# Patient Record
Sex: Male | Born: 1950
Health system: Southern US, Community
[De-identification: ages and names within clinical notes are randomized; demographics above are authoritative.]

## PROBLEM LIST (undated history)

## (undated) DIAGNOSIS — G4700 Insomnia, unspecified: Secondary | ICD-10-CM

## (undated) DIAGNOSIS — I4891 Unspecified atrial fibrillation: Secondary | ICD-10-CM

## (undated) DIAGNOSIS — M797 Fibromyalgia: Secondary | ICD-10-CM

## (undated) DIAGNOSIS — H35039 Hypertensive retinopathy, unspecified eye: Secondary | ICD-10-CM

## (undated) DIAGNOSIS — K219 Gastro-esophageal reflux disease without esophagitis: Secondary | ICD-10-CM

## (undated) DIAGNOSIS — F319 Bipolar disorder, unspecified: Secondary | ICD-10-CM

## (undated) DIAGNOSIS — A0472 Enterocolitis due to Clostridium difficile, not specified as recurrent: Secondary | ICD-10-CM

## (undated) DIAGNOSIS — R7989 Other specified abnormal findings of blood chemistry: Secondary | ICD-10-CM

## (undated) DIAGNOSIS — E119 Type 2 diabetes mellitus without complications: Secondary | ICD-10-CM

## (undated) DIAGNOSIS — I10 Essential (primary) hypertension: Secondary | ICD-10-CM

## (undated) DIAGNOSIS — E039 Hypothyroidism, unspecified: Secondary | ICD-10-CM

## (undated) DIAGNOSIS — G629 Polyneuropathy, unspecified: Secondary | ICD-10-CM

## (undated) DIAGNOSIS — E669 Obesity, unspecified: Secondary | ICD-10-CM

## (undated) DIAGNOSIS — K760 Fatty (change of) liver, not elsewhere classified: Secondary | ICD-10-CM

## (undated) DIAGNOSIS — F32A Depression, unspecified: Secondary | ICD-10-CM

## (undated) DIAGNOSIS — N400 Enlarged prostate without lower urinary tract symptoms: Secondary | ICD-10-CM

## (undated) DIAGNOSIS — E785 Hyperlipidemia, unspecified: Secondary | ICD-10-CM

## (undated) DIAGNOSIS — J45909 Unspecified asthma, uncomplicated: Secondary | ICD-10-CM

## (undated) DIAGNOSIS — R195 Other fecal abnormalities: Secondary | ICD-10-CM

## (undated) DIAGNOSIS — E538 Deficiency of other specified B group vitamins: Secondary | ICD-10-CM

## (undated) HISTORY — DX: Deficiency of other specified B group vitamins: E53.8

## (undated) HISTORY — DX: Other fecal abnormalities: R19.5

## (undated) HISTORY — DX: Hypothyroidism, unspecified: E03.9

## (undated) HISTORY — DX: Enterocolitis due to Clostridium difficile, not specified as recurrent: A04.72

## (undated) HISTORY — DX: Bipolar disorder, unspecified: F31.9

## (undated) HISTORY — DX: Hyperlipidemia, unspecified: E78.5

## (undated) HISTORY — DX: Insomnia, unspecified: G47.00

## (undated) HISTORY — DX: Gastro-esophageal reflux disease without esophagitis: K21.9

## (undated) HISTORY — DX: Obesity, unspecified: E66.9

## (undated) HISTORY — DX: Fatty (change of) liver, not elsewhere classified: K76.0

## (undated) HISTORY — DX: Depression, unspecified: F32.A

## (undated) HISTORY — DX: Polyneuropathy, unspecified: G62.9

## (undated) HISTORY — DX: Other specified abnormal findings of blood chemistry: R79.89

## (undated) HISTORY — DX: Hypertensive retinopathy, unspecified eye: H35.039

## (undated) HISTORY — DX: Unspecified atrial fibrillation: I48.91

## (undated) HISTORY — DX: Benign prostatic hyperplasia without lower urinary tract symptoms: N40.0

## (undated) HISTORY — PX: CHOLECYSTECTOMY: SHX55

## (undated) HISTORY — DX: Fibromyalgia: M79.7

---

## 2013-03-26 DIAGNOSIS — J339 Nasal polyp, unspecified: Secondary | ICD-10-CM | POA: Insufficient documentation

## 2013-03-26 DIAGNOSIS — R490 Dysphonia: Secondary | ICD-10-CM | POA: Insufficient documentation

## 2013-03-26 DIAGNOSIS — J383 Other diseases of vocal cords: Secondary | ICD-10-CM | POA: Insufficient documentation

## 2017-04-20 ENCOUNTER — Emergency Department (HOSPITAL_COMMUNITY)
Admission: EM | Admit: 2017-04-20 | Discharge: 2017-04-20 | Disposition: A | Discharge status: Home / Self Care | Payer: Medicare Other | Attending: Emergency Medicine | Admitting: Emergency Medicine

## 2017-04-20 ENCOUNTER — Emergency Department (HOSPITAL_BASED_OUTPATIENT_CLINIC_OR_DEPARTMENT_OTHER): Admit: 2017-04-20 | Discharge: 2017-04-20 | Disposition: A | Discharge status: Home / Self Care | Payer: Medicare Other

## 2017-04-20 ENCOUNTER — Encounter (HOSPITAL_COMMUNITY): Payer: Self-pay | Admitting: Emergency Medicine

## 2017-04-20 DIAGNOSIS — I10 Essential (primary) hypertension: Secondary | ICD-10-CM | POA: Insufficient documentation

## 2017-04-20 DIAGNOSIS — J45909 Unspecified asthma, uncomplicated: Secondary | ICD-10-CM | POA: Diagnosis not present

## 2017-04-20 DIAGNOSIS — E119 Type 2 diabetes mellitus without complications: Secondary | ICD-10-CM | POA: Insufficient documentation

## 2017-04-20 DIAGNOSIS — L03115 Cellulitis of right lower limb: Secondary | ICD-10-CM | POA: Diagnosis not present

## 2017-04-20 DIAGNOSIS — M79609 Pain in unspecified limb: Secondary | ICD-10-CM | POA: Diagnosis not present

## 2017-04-20 DIAGNOSIS — R2241 Localized swelling, mass and lump, right lower limb: Secondary | ICD-10-CM | POA: Diagnosis present

## 2017-04-20 HISTORY — DX: Essential (primary) hypertension: I10

## 2017-04-20 HISTORY — DX: Type 2 diabetes mellitus without complications: E11.9

## 2017-04-20 HISTORY — DX: Unspecified asthma, uncomplicated: J45.909

## 2017-04-20 LAB — CBC WITH DIFFERENTIAL/PLATELET
BASOS ABS: 0 10*3/uL (ref 0.0–0.1)
Basophils Relative: 1 %
EOS ABS: 0.6 10*3/uL (ref 0.0–0.7)
EOS PCT: 7 %
HCT: 41.5 % (ref 39.0–52.0)
Hemoglobin: 12.8 g/dL — ABNORMAL LOW (ref 13.0–17.0)
Lymphocytes Relative: 24 %
Lymphs Abs: 2 10*3/uL (ref 0.7–4.0)
MCH: 26 pg (ref 26.0–34.0)
MCHC: 30.8 g/dL (ref 30.0–36.0)
MCV: 84.3 fL (ref 78.0–100.0)
Monocytes Absolute: 0.7 10*3/uL (ref 0.1–1.0)
Monocytes Relative: 8 %
Neutro Abs: 5.1 10*3/uL (ref 1.7–7.7)
Neutrophils Relative %: 60 %
PLATELETS: 248 10*3/uL (ref 150–400)
RBC: 4.92 MIL/uL (ref 4.22–5.81)
RDW: 14.6 % (ref 11.5–15.5)
WBC: 8.5 10*3/uL (ref 4.0–10.5)

## 2017-04-20 LAB — COMPREHENSIVE METABOLIC PANEL
ALT: 24 U/L (ref 17–63)
AST: 25 U/L (ref 15–41)
Albumin: 3.5 g/dL (ref 3.5–5.0)
Alkaline Phosphatase: 75 U/L (ref 38–126)
Anion gap: 8 (ref 5–15)
BUN: 7 mg/dL (ref 6–20)
CO2: 20 mmol/L — ABNORMAL LOW (ref 22–32)
CREATININE: 1.11 mg/dL (ref 0.61–1.24)
Calcium: 8.7 mg/dL — ABNORMAL LOW (ref 8.9–10.3)
Chloride: 108 mmol/L (ref 101–111)
GFR calc Af Amer: >60 mL/min (ref >60)
Glucose, Bld: 181 mg/dL — ABNORMAL HIGH (ref 65–99)
POTASSIUM: 3.9 mmol/L (ref 3.5–5.1)
Sodium: 136 mmol/L (ref 135–145)
Total Bilirubin: 0.7 mg/dL (ref 0.3–1.2)
Total Protein: 6.7 g/dL (ref 6.5–8.1)

## 2017-04-20 MED ORDER — CEPHALEXIN 500 MG PO CAPS: 500.0000 mg | ORAL_CAPSULE | Freq: Four times a day (QID) | ORAL | 0 refills | Status: DC

## 2017-04-20 NOTE — Discharge Instructions (Signed)
There are no signs of DVT on the ultrasound.  Please take all of your antibiotics until finished!   You may develop abdominal discomfort or diarrhea from the antibiotic.  You may help offset this with probiotics which you can buy or get in yogurt. Do not eat or take the probiotics until 2 hours after your antibiotic.   Follow-up with your primary care provider later this week or early next week for recheck and further management of this issue.  Return to the ED for worsening symptoms.

## 2017-04-20 NOTE — Progress Notes (Signed)
*  PRELIMINARY RESULTS* Vascular Ultrasound Bilateral lower extremity venous duplex has been completed.  Preliminary findings: No evidence of deep vein thrombosis or baker's cysts bilaterally. Somewhat difficult evaluation of bilateral groin vessels due to heavy panis.   Everrett Coombe 04/20/2017, 4:31 PM

## 2017-04-20 NOTE — ED Provider Notes (Signed)
[facility] Provider Note   CSN: [id number] Arrival date & time: 04/20/17  1059     History   Chief Complaint Chief Complaint  Patient presents with  . Leg Swelling    HPI Antonio Andrade is a 67 y.o. male.  HPI   Antonio Andrade is a 67 y.o. male, with a history of asthma, DM, thrombophlebitis, and HTN, presenting to the ED with bilateral lower extremity edema and erythema beginning about a week ago.  Patient states symptoms began simultaneously.  He does note some previous intermittent lower extremity edema at baseline.  No pain in the lower extremities at rest or with ambulation, however, pain with palpation.  Denies chest pain, shortness of breath, fever/chills, or any other complaints.       Past Medical History:  Diagnosis Date  . Asthma   . Diabetes mellitus without complication ([place])   . Hypertension     There are no active problems to display for this patient.   Past Surgical History:  Procedure Laterality Date  . CHOLECYSTECTOMY         Home Medications    Prior to Admission medications   Medication Sig Start Date End Date Taking? Authorizing Provider  cephALEXin (KEFLEX) 500 MG capsule Take 1 capsule (500 mg total) by mouth 4 (four) times daily. 04/20/17   Tip Atienza, Helane Gunther, PA-C    Family History No family history on file.  Social History Social History   Tobacco Use  . Smoking status: Never Smoker  Substance Use Topics  . Alcohol use: No    Frequency: Never  . Drug use: No     Allergies   Amoxicillin   Review of Systems Review of Systems  Constitutional: Negative for chills and fever.  Respiratory: Negative for shortness of breath.   Cardiovascular: Positive for leg swelling. Negative for chest pain.  Gastrointestinal: Negative for abdominal pain.  Skin: Positive for color change.  Neurological: Negative for weakness and numbness.  All other systems reviewed and are negative.    Physical  Exam Updated Vital Signs BP (!) 153/75 (BP Location: Right Arm)   Pulse 93   Temp 98.2 F (36.8 C) (Oral)   Resp 17   SpO2 97%   Physical Exam  Constitutional: He appears well-developed and well-nourished. No distress.  HENT:  Head: Normocephalic and atraumatic.  Eyes: Conjunctivae are normal.  Neck: Neck supple.  Cardiovascular: Normal rate, regular rhythm, normal heart sounds and intact distal pulses.  Pulses:      Dorsalis pedis pulses are 2+ on the right side, and 2+ on the left side.       Posterior tibial pulses are 2+ on the right side, and 2+ on the left side.  Pulmonary/Chest: Effort normal and breath sounds normal. No respiratory distress.  Abdominal: Soft. There is no tenderness. There is no guarding.  Musculoskeletal: He exhibits edema and tenderness.  Erythema noted to the anterior right lower leg with minor tenderness.  Mild erythema to the anterior left lower leg.  Minor pitting edema noted bilaterally. No tenderness to the calf or elsewhere in the deep venous system.  No pain with range of motion of the lower extremities.  Lymphadenopathy:    He has no cervical adenopathy.  Neurological: He is alert.  Skin: Skin is warm and dry. Capillary refill takes less than 2 seconds. He is not diaphoretic.  Psychiatric: He has a normal mood and affect. His behavior is normal.  Nursing note and vitals  reviewed.            ED Treatments / Results  Labs (all labs ordered are listed, but only abnormal results are displayed) Labs Reviewed  COMPREHENSIVE METABOLIC PANEL - Abnormal; Notable for the following components:      Result Value   CO2 20 (*)    Glucose, Bld 181 (*)    Calcium 8.7 (*)    All other components within normal limits  CBC WITH DIFFERENTIAL/PLATELET - Abnormal; Notable for the following components:   Hemoglobin 12.8 (*)    All other components within normal limits    EKG  EKG Interpretation None       Radiology No results  found.  Procedures Procedures (including critical care time)  Medications Ordered in ED Medications - No data to display   Initial Impression / Assessment and Plan / ED Course  I have reviewed the triage vital signs and the nursing notes.  Pertinent labs & imaging results that were available during my care of the patient were reviewed by me and considered in my medical decision making (see chart for details).     Patient presents with erythema mostly on the right lower extremity.  Suspect possible cellulitis.  No evidence of DVT on ultrasound.  PCP follow-up. The patient was given instructions for home care as well as return precautions. Patient voices understanding of these instructions, accepts the plan, and is comfortable with discharge.  Findings and plan of care discussed with Quintella Reichert, MD. Dr. Ralene Bathe personally evaluated and examined this patient.  Final Clinical Impressions(s) / ED Diagnoses   Final diagnoses:  Cellulitis of right lower extremity    ED Discharge Orders        Ordered    cephALEXin (KEFLEX) 500 MG capsule  4 times daily     04/20/17 1654       Lorayne Bender, PA-C 04/20/17 1702    Quintella Reichert, MD 04/21/17 1557

## 2017-04-20 NOTE — ED Triage Notes (Signed)
Pt reports redness, swelling and pain to bilateral lower legs x1 weeks, pt called his pcp who sent him here. Pt ambulatory to triage. Denies hx of blood clots

## 2017-05-19 ENCOUNTER — Other Ambulatory Visit: Payer: Self-pay | Admitting: Family Medicine

## 2017-05-19 ENCOUNTER — Ambulatory Visit
Admission: RE | Admit: 2017-05-19 | Discharge: 2017-05-19 | Disposition: A | Discharge status: Home / Self Care | Payer: Medicare Other | Source: Ambulatory Visit | Attending: Family Medicine | Admitting: Family Medicine

## 2017-05-19 DIAGNOSIS — L03115 Cellulitis of right lower limb: Secondary | ICD-10-CM

## 2018-03-01 ENCOUNTER — Observation Stay (HOSPITAL_COMMUNITY)
Admission: EM | Admit: 2018-03-01 | Discharge: 2018-03-03 | Disposition: A | Discharge status: Home / Self Care | Payer: Medicare Other | Attending: Internal Medicine | Admitting: Internal Medicine

## 2018-03-01 ENCOUNTER — Emergency Department (HOSPITAL_COMMUNITY): Payer: Medicare Other

## 2018-03-01 ENCOUNTER — Other Ambulatory Visit: Payer: Self-pay

## 2018-03-01 ENCOUNTER — Encounter (HOSPITAL_COMMUNITY): Payer: Self-pay | Admitting: Obstetrics and Gynecology

## 2018-03-01 DIAGNOSIS — Z79890 Hormone replacement therapy (postmenopausal): Secondary | ICD-10-CM | POA: Insufficient documentation

## 2018-03-01 DIAGNOSIS — E039 Hypothyroidism, unspecified: Secondary | ICD-10-CM | POA: Diagnosis present

## 2018-03-01 DIAGNOSIS — E785 Hyperlipidemia, unspecified: Secondary | ICD-10-CM | POA: Diagnosis present

## 2018-03-01 DIAGNOSIS — N179 Acute kidney failure, unspecified: Secondary | ICD-10-CM | POA: Insufficient documentation

## 2018-03-01 DIAGNOSIS — L03116 Cellulitis of left lower limb: Principal | ICD-10-CM | POA: Diagnosis present

## 2018-03-01 DIAGNOSIS — F329 Major depressive disorder, single episode, unspecified: Secondary | ICD-10-CM | POA: Diagnosis not present

## 2018-03-01 DIAGNOSIS — Z7901 Long term (current) use of anticoagulants: Secondary | ICD-10-CM | POA: Diagnosis not present

## 2018-03-01 DIAGNOSIS — R635 Abnormal weight gain: Secondary | ICD-10-CM | POA: Diagnosis present

## 2018-03-01 DIAGNOSIS — I4891 Unspecified atrial fibrillation: Secondary | ICD-10-CM | POA: Insufficient documentation

## 2018-03-01 DIAGNOSIS — M79605 Pain in left leg: Secondary | ICD-10-CM | POA: Diagnosis present

## 2018-03-01 DIAGNOSIS — I10 Essential (primary) hypertension: Secondary | ICD-10-CM | POA: Diagnosis not present

## 2018-03-01 DIAGNOSIS — J45909 Unspecified asthma, uncomplicated: Secondary | ICD-10-CM | POA: Diagnosis not present

## 2018-03-01 DIAGNOSIS — L03115 Cellulitis of right lower limb: Secondary | ICD-10-CM | POA: Insufficient documentation

## 2018-03-01 DIAGNOSIS — F32A Depression, unspecified: Secondary | ICD-10-CM | POA: Diagnosis present

## 2018-03-01 DIAGNOSIS — K219 Gastro-esophageal reflux disease without esophagitis: Secondary | ICD-10-CM | POA: Diagnosis present

## 2018-03-01 DIAGNOSIS — D649 Anemia, unspecified: Secondary | ICD-10-CM | POA: Diagnosis not present

## 2018-03-01 DIAGNOSIS — E119 Type 2 diabetes mellitus without complications: Secondary | ICD-10-CM

## 2018-03-01 DIAGNOSIS — I5032 Chronic diastolic (congestive) heart failure: Secondary | ICD-10-CM | POA: Diagnosis present

## 2018-03-01 DIAGNOSIS — F39 Unspecified mood [affective] disorder: Secondary | ICD-10-CM | POA: Diagnosis present

## 2018-03-01 DIAGNOSIS — Z880 Allergy status to penicillin: Secondary | ICD-10-CM | POA: Diagnosis not present

## 2018-03-01 DIAGNOSIS — Z7984 Long term (current) use of oral hypoglycemic drugs: Secondary | ICD-10-CM | POA: Insufficient documentation

## 2018-03-01 DIAGNOSIS — E1140 Type 2 diabetes mellitus with diabetic neuropathy, unspecified: Secondary | ICD-10-CM | POA: Diagnosis present

## 2018-03-01 DIAGNOSIS — Z79899 Other long term (current) drug therapy: Secondary | ICD-10-CM | POA: Diagnosis not present

## 2018-03-01 MED ORDER — ACETAMINOPHEN 325 MG PO TABS
650.0000 mg | ORAL_TABLET | Freq: Once | ORAL | Status: AC
  Administered 2018-03-01: 650 mg via ORAL
  Filled 2018-03-01: qty 2

## 2018-03-01 NOTE — ED Triage Notes (Signed)
Pt reports he has cellulitis in both legs. Pt reports he has tried 2 rounds of oral antibiotics and that has not treated it. Pt reports he needs IV antibiotics.

## 2018-03-01 NOTE — ED Notes (Signed)
EKG given to EDP,Schlossman,MD., for review. 

## 2018-03-01 NOTE — ED Provider Notes (Signed)
[facility] DEPT Provider Note   CSN: [id number] Arrival date & time: 03/01/18  1755     History   Chief Complaint Chief Complaint  Patient presents with  . Cellulitis    HPI Antonio Andrade is a 67 y.o. male.  HPI   Presents with concern for bilateral leg pain and redness 9 weeks of symptoms, worsening, redness is spreading up and around Was on cephalexin, clindamycin without relief No fevers, no chills  Previously had redness of both lower legs, right worse than left, 4 months total, had XR done, and [place] as well, referred to dermatology but did not end up going, however symptoms improved prior to worsening over the last weeks.    165 sugar today is very high for patient, reports he is normally in the 120s Gained 22lb in 4 weeks  Past Medical History:  Diagnosis Date  . Asthma   . Diabetes mellitus without complication ([place])   . Hypertension     There are no active problems to display for this patient.   Past Surgical History:  Procedure Laterality Date  . CHOLECYSTECTOMY          Home Medications    Prior to Admission medications   Medication Sig Start Date End Date Taking? Authorizing Provider  ARIPiprazole (ABILIFY) 2 MG tablet Take 2 mg by mouth daily. 02/08/18  Yes [provider]  atorvastatin (LIPITOR) 40 MG tablet Take 40 mg by mouth daily. 12/23/17  Yes [provider]  ciprofloxacin (CIPRO) 500 MG tablet Take 500 mg by mouth 2 (two) times daily. 02/27/18  Yes [provider]  Clobetasol Propionate (TEMOVATE) 0.05 % external spray Apply 1 application topically daily as needed for rash.   Yes [provider]  dabigatran (PRADAXA) 150 MG CAPS capsule Take 150 mg by mouth 2 (two) times daily.   Yes [provider]  gabapentin (NEURONTIN) 300 MG capsule Take 300 mg by mouth 2 (two) times daily. 02/08/18  Yes [provider]  INVOKANA 300 MG TABS tablet Take 300 mg by mouth  daily. 12/23/17  Yes [provider]  JANUVIA 100 MG tablet Take 100 mg by mouth daily. 12/26/17  Yes [provider]  levothyroxine (SYNTHROID, LEVOTHROID) 75 MCG tablet Take 75 mcg by mouth daily. 02/22/18  Yes [provider]  metFORMIN (GLUCOPHAGE) 1000 MG tablet Take 1,000 mg by mouth 2 (two) times daily. 12/23/17  Yes [provider]  metoprolol (TOPROL-XL) 200 MG 24 hr tablet Take 200 mg by mouth daily.   Yes [provider]  omeprazole (PRILOSEC) 20 MG capsule Take 20 mg by mouth daily. 02/18/18  Yes [provider]  perphenazine (TRILAFON) 2 MG tablet Take 2 mg by mouth at bedtime.   Yes [provider]  PROAIR HFA 108 (90 Base) MCG/ACT inhaler Inhale 2-4 puffs into the lungs. Every 4 to 6 hours as needed for wheezing or shortness of breath 02/27/18  Yes [provider]  sertraline (ZOLOFT) 100 MG tablet Take 100 mg by mouth daily. 12/23/17  Yes [provider]  telmisartan (MICARDIS) 80 MG tablet Take 80 mg by mouth daily. 02/26/18  Yes [provider]  topiramate (TOPAMAX) 100 MG tablet Take 100 mg by mouth at bedtime. 12/26/17  Yes [provider]  traZODone (DESYREL) 100 MG tablet Take 100 mg by mouth at bedtime. 02/08/18  Yes [provider]  cephALEXin (KEFLEX) 500 MG capsule Take 1 capsule (500 mg total) by mouth 4 (  four) times daily. Patient not taking: Reported on 03/01/2018 04/20/17   Lorayne Bender, PA-C    Family History No family history on file.  Social History Social History   Tobacco Use  . Smoking status: Never Smoker  . Smokeless tobacco: Never Used  Substance Use Topics  . Alcohol use: No    Frequency: Never  . Drug use: No     Allergies   Amoxicillin; Penicillin g; Sulfa antibiotics; and Sulfamethoxazole   Review of Systems Review of Systems  Constitutional: Negative for chills and fever.  HENT: Negative for sore throat.   Eyes: Negative for visual  disturbance.  Respiratory: Positive for shortness of breath.   Cardiovascular: Positive for leg swelling. Negative for chest pain.  Gastrointestinal: Negative for abdominal pain, nausea and vomiting.  Genitourinary: Negative for difficulty urinating.  Musculoskeletal: Negative for back pain.  Skin: Positive for rash.  Neurological: Negative for syncope and headaches.     Physical Exam Updated Vital Signs BP (!) 170/71 (BP Location: Left Arm)   Pulse 92   Temp (!) 97.4 F (36.3 C) (Oral)   Resp 18   Ht 5' 10.75" (1.797 m)   Wt 132 kg   SpO2 99%   BMI 40.87 kg/m   Physical Exam  Constitutional: He is oriented to person, place, and time. He appears well-developed and well-nourished. No distress.  HENT:  Head: Normocephalic and atraumatic.  Eyes: Conjunctivae and EOM are normal.  Neck: Normal range of motion.  Cardiovascular: Normal rate, regular rhythm, normal heart sounds and intact distal pulses. Exam reveals no gallop and no friction rub.  No murmur heard. Pulmonary/Chest: Effort normal and breath sounds normal. No respiratory distress. He has no wheezes. He has no rales.  Abdominal: Soft. He exhibits no distension. There is no tenderness. There is no guarding.  Musculoskeletal: He exhibits edema (2-3+).  Neurological: He is alert and oriented to person, place, and time.  Skin: Skin is warm and dry. Rash (bilateral lower extremities with warmth, redness to approx 4cm below the knee, hairless, erythema extending into proximal feet) noted. He is not diaphoretic.  Nursing note and vitals reviewed.    ED Treatments / Results  Labs (all labs ordered are listed, but only abnormal results are displayed) Labs Reviewed  CBC WITH DIFFERENTIAL/PLATELET - Abnormal; Notable for the following components:      Result Value   Hemoglobin 12.6 (*)    MCHC 29.9 (*)    RDW 16.8 (*)    Eosinophils Absolute 0.7 (*)    All other components within normal limits  CULTURE, BLOOD (ROUTINE X  2)  CULTURE, BLOOD (ROUTINE X 2)  COMPREHENSIVE METABOLIC PANEL  BRAIN NATRIURETIC PEPTIDE  I-STAT CG4 LACTIC ACID, ED  I-STAT CG4 LACTIC ACID, ED    EKG EKG Interpretation  Date/Time:  Wednesday March 01 2018 23:53:24 EST Ventricular Rate:  74 PR Interval:    QRS Duration: 105 QT Interval:  404 QTC Calculation: 449 R Axis:   52 Text Interpretation:  Sinus rhythm Low voltage, precordial leads Probable anteroseptal infarct, old No previous ECGs available Confirmed by Gareth Morgan [phone]) on 03/01/2018 11:56:09 PM   Radiology Dg Chest 2 View  Result Date: 03/01/2018 CLINICAL DATA:  Cellulitis of both legs status post 2 rounds of oral antibiotics without improvement. EXAM: CHEST - 2 VIEW COMPARISON:  None. FINDINGS: The heart size and mediastinal contours are within normal limits. Both lungs are clear. The visualized skeletal structures are unremarkable. IMPRESSION: No active cardiopulmonary disease.  Electronically Signed   By: Ashley Royalty M.D.   On: 03/01/2018 23:00    Procedures Procedures (including critical care time)  Medications Ordered in ED Medications  acetaminophen (TYLENOL) tablet 650 mg (650 mg Oral Given 03/01/18 2315)     Initial Impression / Assessment and Plan / ED Course  I have reviewed the triage vital signs and the nursing notes.  Pertinent labs & imaging results that were available during my care of the patient were reviewed by me and considered in my medical decision making (see chart for details).     67 year old male with a history of asthma, hypertension, diabetes, presents with concern for bilateral lower extremity pain, erythema and swelling.  Reports he has had waxing and waning symptoms over the last 4 months, but significant worsening recently, without improvement with outpatient antibiotics.  Given bilateral erythema, consider vascular insufficiency as etiology, however patient with significant warmth, pain and redness and do suspect  overlying cellulitis. Trace pulses present bilaterally, doubt acute arterial occlusion. Doubt bilateral DVT.   Given report of increased weight gain, increased bilateral lower extremity swelling, dyspnea, ordered labs including BNP, consider underlying CHF.  Chest x-ray done shows no evidence of pulmonary edema.  Given concern for cellulitis on exam that has failed outpatient antibiotics, feel admission for IV abx is appropriate.  Given vancomycin.    Labs ordered and pending at time of transfer of care to Dr. Florina Ou. Anticipate admission for cellulitis when labs return.   Final Clinical Impressions(s) / ED Diagnoses   Final diagnoses:  Cellulitis of lower extremity, unspecified laterality    ED Discharge Orders    None       Gareth Morgan, MD 03/02/18 0030

## 2018-03-02 ENCOUNTER — Ambulatory Visit (HOSPITAL_BASED_OUTPATIENT_CLINIC_OR_DEPARTMENT_OTHER): Payer: Medicare Other

## 2018-03-02 ENCOUNTER — Observation Stay (HOSPITAL_BASED_OUTPATIENT_CLINIC_OR_DEPARTMENT_OTHER): Payer: Medicare Other

## 2018-03-02 ENCOUNTER — Encounter (HOSPITAL_COMMUNITY): Payer: Self-pay

## 2018-03-02 DIAGNOSIS — D649 Anemia, unspecified: Secondary | ICD-10-CM | POA: Diagnosis present

## 2018-03-02 DIAGNOSIS — I10 Essential (primary) hypertension: Secondary | ICD-10-CM | POA: Diagnosis present

## 2018-03-02 DIAGNOSIS — E1140 Type 2 diabetes mellitus with diabetic neuropathy, unspecified: Secondary | ICD-10-CM | POA: Diagnosis present

## 2018-03-02 DIAGNOSIS — K219 Gastro-esophageal reflux disease without esophagitis: Secondary | ICD-10-CM | POA: Diagnosis present

## 2018-03-02 DIAGNOSIS — I5030 Unspecified diastolic (congestive) heart failure: Secondary | ICD-10-CM

## 2018-03-02 DIAGNOSIS — J45909 Unspecified asthma, uncomplicated: Secondary | ICD-10-CM | POA: Diagnosis present

## 2018-03-02 DIAGNOSIS — L03119 Cellulitis of unspecified part of limb: Secondary | ICD-10-CM

## 2018-03-02 DIAGNOSIS — F39 Unspecified mood [affective] disorder: Secondary | ICD-10-CM | POA: Diagnosis present

## 2018-03-02 DIAGNOSIS — E119 Type 2 diabetes mellitus without complications: Secondary | ICD-10-CM | POA: Insufficient documentation

## 2018-03-02 DIAGNOSIS — F32A Depression, unspecified: Secondary | ICD-10-CM | POA: Diagnosis present

## 2018-03-02 DIAGNOSIS — R635 Abnormal weight gain: Secondary | ICD-10-CM | POA: Diagnosis present

## 2018-03-02 DIAGNOSIS — R609 Edema, unspecified: Secondary | ICD-10-CM | POA: Diagnosis not present

## 2018-03-02 DIAGNOSIS — E785 Hyperlipidemia, unspecified: Secondary | ICD-10-CM | POA: Diagnosis present

## 2018-03-02 DIAGNOSIS — E039 Hypothyroidism, unspecified: Secondary | ICD-10-CM | POA: Diagnosis present

## 2018-03-02 DIAGNOSIS — F329 Major depressive disorder, single episode, unspecified: Secondary | ICD-10-CM | POA: Diagnosis present

## 2018-03-02 LAB — HIV ANTIBODY (ROUTINE TESTING W REFLEX): HIV SCREEN 4TH GENERATION: NONREACTIVE

## 2018-03-02 LAB — URINALYSIS, ROUTINE W REFLEX MICROSCOPIC
BACTERIA UA: NONE SEEN
Bilirubin Urine: NEGATIVE
Glucose, UA: >=500 mg/dL — AB
Hgb urine dipstick: NEGATIVE
KETONES UR: NEGATIVE mg/dL
LEUKOCYTES UA: NEGATIVE
Nitrite: NEGATIVE
PH: 6 (ref 5.0–8.0)
Protein, ur: NEGATIVE mg/dL
SPECIFIC GRAVITY, URINE: 1.026 (ref 1.005–1.030)

## 2018-03-02 LAB — COMPREHENSIVE METABOLIC PANEL
ALBUMIN: 3.8 g/dL (ref 3.5–5.0)
ALT: 19 U/L (ref 0–44)
ANION GAP: 7 (ref 5–15)
AST: 22 U/L (ref 15–41)
Alkaline Phosphatase: 81 U/L (ref 38–126)
BUN: 12 mg/dL (ref 8–23)
CO2: 24 mmol/L (ref 22–32)
Calcium: 8.6 mg/dL — ABNORMAL LOW (ref 8.9–10.3)
Chloride: 109 mmol/L (ref 98–111)
Creatinine, Ser: 1.11 mg/dL (ref 0.61–1.24)
GFR calc Af Amer: >60 mL/min (ref >60)
GFR calc non Af Amer: >60 mL/min (ref >60)
GLUCOSE: 144 mg/dL — AB (ref 70–99)
POTASSIUM: 4.1 mmol/L (ref 3.5–5.1)
SODIUM: 140 mmol/L (ref 135–145)
TOTAL PROTEIN: 7.5 g/dL (ref 6.5–8.1)
Total Bilirubin: 0.9 mg/dL (ref 0.3–1.2)

## 2018-03-02 LAB — CBC WITH DIFFERENTIAL/PLATELET
Abs Immature Granulocytes: 0.04 10*3/uL (ref 0.00–0.07)
BASOS ABS: 0.1 10*3/uL (ref 0.0–0.1)
Basophils Relative: 1 %
EOS PCT: 8 %
Eosinophils Absolute: 0.7 10*3/uL — ABNORMAL HIGH (ref 0.0–0.5)
HCT: 42.2 % (ref 39.0–52.0)
Hemoglobin: 12.6 g/dL — ABNORMAL LOW (ref 13.0–17.0)
Immature Granulocytes: 0 %
Lymphocytes Relative: 22 %
Lymphs Abs: 2 10*3/uL (ref 0.7–4.0)
MCH: 26.7 pg (ref 26.0–34.0)
MCHC: 29.9 g/dL — AB (ref 30.0–36.0)
MCV: 89.4 fL (ref 80.0–100.0)
Monocytes Absolute: 0.7 10*3/uL (ref 0.1–1.0)
Monocytes Relative: 7 %
NRBC: 0 % (ref 0.0–0.2)
Neutro Abs: 5.5 10*3/uL (ref 1.7–7.7)
Neutrophils Relative %: 62 %
PLATELETS: 266 10*3/uL (ref 150–400)
RBC: 4.72 MIL/uL (ref 4.22–5.81)
RDW: 16.8 % — ABNORMAL HIGH (ref 11.5–15.5)
WBC: 8.9 10*3/uL (ref 4.0–10.5)

## 2018-03-02 LAB — BRAIN NATRIURETIC PEPTIDE: B Natriuretic Peptide: 18.9 pg/mL (ref 0.0–100.0)

## 2018-03-02 LAB — ECHOCARDIOGRAM COMPLETE
HEIGHTINCHES: 70.75 in
WEIGHTICAEL: 4656 [oz_av]

## 2018-03-02 LAB — GLUCOSE, CAPILLARY
GLUCOSE-CAPILLARY: 129 mg/dL — AB (ref 70–99)
Glucose-Capillary: 169 mg/dL — ABNORMAL HIGH (ref 70–99)
Glucose-Capillary: 140 mg/dL — ABNORMAL HIGH (ref 70–99)
Glucose-Capillary: 151 mg/dL — ABNORMAL HIGH (ref 70–99)

## 2018-03-02 LAB — HEMOGLOBIN A1C
HEMOGLOBIN A1C: 6.9 % — AB (ref 4.8–5.6)
MEAN PLASMA GLUCOSE: 151.33 mg/dL

## 2018-03-02 LAB — TSH: TSH: 9.229 u[IU]/mL — ABNORMAL HIGH (ref 0.350–4.500)

## 2018-03-02 LAB — I-STAT CG4 LACTIC ACID, ED: Lactic Acid, Venous: 1.47 mmol/L (ref 0.5–1.9)

## 2018-03-02 MED ORDER — VANCOMYCIN HCL 10 G IV SOLR
1750.0000 mg | INTRAVENOUS | Status: DC
  Filled 2018-03-02: qty 1750

## 2018-03-02 MED ORDER — ATORVASTATIN CALCIUM 40 MG PO TABS
40.0000 mg | ORAL_TABLET | Freq: Every day | ORAL | Status: DC
  Administered 2018-03-02 – 2018-03-03 (×2): 40 mg via ORAL
  Filled 2018-03-02 (×2): qty 1

## 2018-03-02 MED ORDER — HYDRALAZINE HCL 20 MG/ML IJ SOLN: 5.0000 mg | INTRAMUSCULAR | Status: DC | PRN

## 2018-03-02 MED ORDER — LEVOTHYROXINE SODIUM 50 MCG PO TABS
75.0000 ug | ORAL_TABLET | Freq: Every day | ORAL | Status: DC
  Administered 2018-03-02: 75 ug via ORAL
  Filled 2018-03-02: qty 1

## 2018-03-02 MED ORDER — VANCOMYCIN HCL 10 G IV SOLR
2500.0000 mg | Freq: Once | INTRAVENOUS | Status: AC
  Administered 2018-03-02: 2500 mg via INTRAVENOUS
  Filled 2018-03-02: qty 2000

## 2018-03-02 MED ORDER — CEFAZOLIN SODIUM-DEXTROSE 2-4 GM/100ML-% IV SOLN: 2.0000 g | Freq: Three times a day (TID) | INTRAVENOUS | Status: DC

## 2018-03-02 MED ORDER — ACETAMINOPHEN 325 MG PO TABS: 650.0000 mg | ORAL_TABLET | Freq: Four times a day (QID) | ORAL | Status: DC | PRN

## 2018-03-02 MED ORDER — INSULIN ASPART 100 UNIT/ML ~~LOC~~ SOLN
0.0000 [IU] | Freq: Three times a day (TID) | SUBCUTANEOUS | Status: DC
  Administered 2018-03-02: 1 [IU] via SUBCUTANEOUS
  Administered 2018-03-02: 2 [IU] via SUBCUTANEOUS
  Administered 2018-03-02 – 2018-03-03 (×3): 1 [IU] via SUBCUTANEOUS

## 2018-03-02 MED ORDER — GABAPENTIN 300 MG PO CAPS
300.0000 mg | ORAL_CAPSULE | Freq: Two times a day (BID) | ORAL | Status: DC
  Administered 2018-03-02 – 2018-03-03 (×3): 300 mg via ORAL
  Filled 2018-03-02 (×3): qty 1

## 2018-03-02 MED ORDER — PANTOPRAZOLE SODIUM 40 MG PO TBEC
40.0000 mg | DELAYED_RELEASE_TABLET | Freq: Every day | ORAL | Status: DC
  Administered 2018-03-02 – 2018-03-03 (×2): 40 mg via ORAL
  Filled 2018-03-02 (×2): qty 1

## 2018-03-02 MED ORDER — SERTRALINE HCL 100 MG PO TABS
100.0000 mg | ORAL_TABLET | Freq: Every day | ORAL | Status: DC
  Administered 2018-03-02 – 2018-03-03 (×2): 100 mg via ORAL
  Filled 2018-03-02: qty 2
  Filled 2018-03-02: qty 1

## 2018-03-02 MED ORDER — TOPIRAMATE 100 MG PO TABS
100.0000 mg | ORAL_TABLET | Freq: Every day | ORAL | Status: DC
  Administered 2018-03-02: 100 mg via ORAL
  Filled 2018-03-02: qty 1

## 2018-03-02 MED ORDER — PERFLUTREN LIPID MICROSPHERE
1.0000 mL | INTRAVENOUS | Status: AC | PRN
  Administered 2018-03-02: 2 mL via INTRAVENOUS
  Filled 2018-03-02: qty 10

## 2018-03-02 MED ORDER — ENOXAPARIN SODIUM 40 MG/0.4ML ~~LOC~~ SOLN
40.0000 mg | SUBCUTANEOUS | Status: DC
  Administered 2018-03-02: 40 mg via SUBCUTANEOUS
  Filled 2018-03-02: qty 0.4

## 2018-03-02 MED ORDER — PERPHENAZINE 2 MG PO TABS
2.0000 mg | ORAL_TABLET | Freq: Every day | ORAL | Status: DC
  Administered 2018-03-02: 2 mg via ORAL
  Filled 2018-03-02: qty 1

## 2018-03-02 MED ORDER — SODIUM CHLORIDE 0.9 % IV SOLN
INTRAVENOUS | Status: DC | PRN
  Administered 2018-03-02 – 2018-03-03 (×2): 500 mL via INTRAVENOUS

## 2018-03-02 MED ORDER — FUROSEMIDE 10 MG/ML IJ SOLN
60.0000 mg | Freq: Once | INTRAMUSCULAR | Status: AC
  Administered 2018-03-02: 60 mg via INTRAVENOUS
  Filled 2018-03-02: qty 6

## 2018-03-02 MED ORDER — ENOXAPARIN SODIUM 80 MG/0.8ML ~~LOC~~ SOLN
0.5000 mg/kg | SUBCUTANEOUS | Status: DC
  Administered 2018-03-03: 65 mg via SUBCUTANEOUS
  Filled 2018-03-02: qty 0.8

## 2018-03-02 MED ORDER — METOPROLOL SUCCINATE ER 50 MG PO TB24
200.0000 mg | ORAL_TABLET | Freq: Every day | ORAL | Status: DC
  Administered 2018-03-02 – 2018-03-03 (×2): 200 mg via ORAL
  Filled 2018-03-02: qty 2
  Filled 2018-03-02: qty 4

## 2018-03-02 MED ORDER — ARIPIPRAZOLE 2 MG PO TABS
2.0000 mg | ORAL_TABLET | Freq: Every day | ORAL | Status: DC
  Administered 2018-03-02 – 2018-03-03 (×2): 2 mg via ORAL
  Filled 2018-03-02 (×2): qty 1

## 2018-03-02 MED ORDER — FUROSEMIDE 10 MG/ML IJ SOLN: 40.0000 mg | Freq: Once | INTRAMUSCULAR | Status: DC

## 2018-03-02 MED ORDER — LEVOTHYROXINE SODIUM 100 MCG PO TABS
100.0000 ug | ORAL_TABLET | Freq: Every day | ORAL | Status: DC
  Administered 2018-03-03: 100 ug via ORAL
  Filled 2018-03-02: qty 1

## 2018-03-02 MED ORDER — ALBUTEROL SULFATE (2.5 MG/3ML) 0.083% IN NEBU: 3.0000 mL | INHALATION_SOLUTION | RESPIRATORY_TRACT | Status: DC | PRN

## 2018-03-02 MED ORDER — TRAZODONE HCL 50 MG PO TABS
100.0000 mg | ORAL_TABLET | Freq: Every day | ORAL | Status: DC
  Administered 2018-03-02: 100 mg via ORAL
  Filled 2018-03-02: qty 2

## 2018-03-02 NOTE — Progress Notes (Signed)
Pharmacy Antibiotic Note  Antonio Andrade is a 67 y.o. male admitted on 03/01/2018 with cellulitis.  Pharmacy has been consulted for vancomycin dosing.  Plan: Vancomycin 2500 mg x1 then 1750 mg IV q24h for est AUC = 469 Goal AUC = 400-500 F/u scr/cultures/levels  Height: 5' 10.75" (179.7 cm) Weight: 291 lb (132 kg) IBW/kg (Calculated) : 74.73  Temp (24hrs), Avg:97.4 F (36.3 C), Min:97.4 F (36.3 C), Max:97.4 F (36.3 C)  Recent Labs  Lab 03/01/18 2357 03/02/18 0200  WBC 8.9  --   CREATININE 1.11  --   LATICACIDVEN  --  1.47    Estimated Creatinine Clearance: 86.7 mL/min (by C-G formula based on SCr of 1.11 mg/dL).    Allergies  Allergen Reactions  . Amoxicillin Nausea Only    Has patient had a PCN reaction causing immediate rash, facial/tongue/throat swelling, SOB or lightheadedness with hypotension:No Has patient had a PCN reaction causing severe rash involving mucus membranes or skin necrosis: No Has patient had a PCN reaction that required hospitalization: No Has patient had a PCN reaction occurring within the last 10 years: No If all of the above answers are "NO", then may proceed with Cephalosporin use.   Marland Kitchen Penicillin G Nausea And Vomiting    Has patient had a PCN reaction causing immediate rash, facial/tongue/throat swelling, SOB or lightheadedness with hypotension: UNKNOWN Has patient had a PCN reaction causing severe rash involving mucus membranes or skin necrosis: Unknown Has patient had a PCN reaction that required hospitalization: Unknown Has patient had a PCN reaction occurring within the last 10 years: Unknown If all of the above answers are "NO", then may proceed with Cephalosporin use.   . Sulfa Antibiotics Itching  . Sulfamethoxazole Itching    Antimicrobials this admission: 11/4 vancomycin >>    >>   Dose adjustments this admission:   Microbiology results:  BCx:   UCx:    Sputum:    MRSA PCR:  Thank you for allowing pharmacy to be a part of  this patient's care.  Dorrene German 03/02/2018 5:53 AM

## 2018-03-02 NOTE — Progress Notes (Signed)
I have reviewed and concur with this student's documentation.   

## 2018-03-02 NOTE — H&P (Signed)
History and Physical    [name]:[id number] DOB: Aug 21, 1950 DOA: 03/01/2018  PCP: London Pepper, MD Patient coming from: Home  Chief Complaint: Bilateral lower extremity edema and erythema  HPI: Antonio Andrade is a 67 y.o. male with medical history significant of HTN, DM, asthma presenting to the hospital for evaluation of bilateral LE erythema and swelling. Patient states his symptoms started 9 weeks ago and have been getting progressively worse.  Denies having any calf pain. States he saw his PCP 4 weeks ago and was prescribed antibiotics which didn't help. Denies having any fever, chills, nausea, and vomiting. States he was told he has heart failure several years ago. States he has gained 22 lbs in the past 4 weeks. Denies having any dyspnea, orthopnea, or PND.   ED Course: Afebrile and HDS. No leukocytosis. BNP 18.9. CXR showing no active cardiopulmonary disease.   Review of Systems: As per HPI otherwise 10 point review of systems negative.  Past Medical History:  Diagnosis Date  . Asthma   . Diabetes mellitus without complication ([place])   . Hypertension     Past Surgical History:  Procedure Laterality Date  . CHOLECYSTECTOMY       reports that he has never smoked. He has never used smokeless tobacco. He reports that he does not drink alcohol or use drugs.  Allergies  Allergen Reactions  . Amoxicillin Nausea Only    Has patient had a PCN reaction causing immediate rash, facial/tongue/throat swelling, SOB or lightheadedness with hypotension:No Has patient had a PCN reaction causing severe rash involving mucus membranes or skin necrosis: No Has patient had a PCN reaction that required hospitalization: No Has patient had a PCN reaction occurring within the last 10 years: No If all of the above answers are "NO", then may proceed with Cephalosporin use.   Marland Kitchen Penicillin G Nausea And Vomiting    Has patient had a PCN reaction causing immediate rash, facial/tongue/throat swelling,  SOB or lightheadedness with hypotension: UNKNOWN Has patient had a PCN reaction causing severe rash involving mucus membranes or skin necrosis: Unknown Has patient had a PCN reaction that required hospitalization: Unknown Has patient had a PCN reaction occurring within the last 10 years: Unknown If all of the above answers are "NO", then may proceed with Cephalosporin use.   . Sulfa Antibiotics Itching  . Sulfamethoxazole Itching    No family history on file.  Prior to Admission medications   Medication Sig Start Date End Date Taking? Authorizing Provider  ARIPiprazole (ABILIFY) 2 MG tablet Take 2 mg by mouth daily. 02/08/18  Yes [provider]  atorvastatin (LIPITOR) 40 MG tablet Take 40 mg by mouth daily. 12/23/17  Yes [provider]  ciprofloxacin (CIPRO) 500 MG tablet Take 500 mg by mouth 2 (two) times daily. 02/27/18  Yes [provider]  Clobetasol Propionate (TEMOVATE) 0.05 % external spray Apply 1 application topically daily as needed for rash.   Yes [provider]  dabigatran (PRADAXA) 150 MG CAPS capsule Take 150 mg by mouth 2 (two) times daily.   Yes [provider]  gabapentin (NEURONTIN) 300 MG capsule Take 300 mg by mouth 2 (two) times daily. 02/08/18  Yes [provider]  INVOKANA 300 MG TABS tablet Take 300 mg by mouth daily. 12/23/17  Yes [provider]  JANUVIA 100 MG tablet Take 100 mg by mouth daily. 12/26/17  Yes [provider]  levothyroxine (SYNTHROID, LEVOTHROID) 75 MCG tablet Take 75 mcg by mouth daily. 02/22/18  Yes [provider]  metFORMIN (GLUCOPHAGE) 1000 MG tablet Take 1,000 mg by mouth 2 (two) times daily. 12/23/17  Yes [provider]  metoprolol (TOPROL-XL) 200 MG 24 hr tablet Take 200 mg by mouth daily.   Yes [provider]  omeprazole (PRILOSEC) 20 MG capsule Take 20 mg by mouth daily. 02/18/18  Yes [provider]  perphenazine (TRILAFON) 2 MG tablet  Take 2 mg by mouth at bedtime.   Yes [provider]  PROAIR HFA 108 (90 Base) MCG/ACT inhaler Inhale 2-4 puffs into the lungs. Every 4 to 6 hours as needed for wheezing or shortness of breath 02/27/18  Yes [provider]  sertraline (ZOLOFT) 100 MG tablet Take 100 mg by mouth daily. 12/23/17  Yes [provider]  telmisartan (MICARDIS) 80 MG tablet Take 80 mg by mouth daily. 02/26/18  Yes [provider]  topiramate (TOPAMAX) 100 MG tablet Take 100 mg by mouth at bedtime. 12/26/17  Yes [provider]  traZODone (DESYREL) 100 MG tablet Take 100 mg by mouth at bedtime. 02/08/18  Yes [provider]    Physical Exam: Vitals:   03/01/18 1801 03/01/18 1804 03/01/18 2113  BP: 140/67  (!) 170/71  Pulse: 83  92  Resp: 15  18  Temp: (!) 97.4 F (36.3 C)    TempSrc: Oral    SpO2: 98%  99%  Weight:  132 kg   Height:  5' 10.75" (1.797 m)     Physical Exam  Constitutional: He is oriented to person, place, and time. He appears well-developed and well-nourished. No distress.  HENT:  Head: Normocephalic.  Mouth/Throat: Oropharynx is clear and moist.  Eyes: Right eye exhibits no discharge. Left eye exhibits no discharge.  Neck: Neck supple. No tracheal deviation present.  Cardiovascular: Normal rate, regular rhythm and intact distal pulses.  Pulmonary/Chest: Effort normal and breath sounds normal. No respiratory distress. He has no wheezes. He has no rales.  Abdominal: Soft. Bowel sounds are normal. He exhibits no distension. There is no tenderness.  Musculoskeletal: He exhibits edema.  +2 bilateral lower extremity pitting edema Bilateral lower extremities: Symmetric in size.  Appear erythematous and warm to touch.  Neurological: He is alert and oriented to person, place, and time.  Skin: Skin is warm and dry. He is not diaphoretic.  Psychiatric: He has a normal mood and affect. His behavior is normal.    Labs on Admission: I have personally  reviewed following labs and imaging studies  CBC: Recent Labs  Lab 03/01/18 2357  WBC 8.9  NEUTROABS 5.5  HGB 12.6*  HCT 42.2  MCV 89.4  PLT 761   Basic Metabolic Panel: Recent Labs  Lab 03/01/18 2357  NA 140  K 4.1  CL 109  CO2 24  GLUCOSE 144*  BUN 12  CREATININE 1.11  CALCIUM 8.6*   GFR: Estimated Creatinine Clearance: 86.7 mL/min (by C-G formula based on SCr of 1.11 mg/dL). Liver Function Tests: Recent Labs  Lab 03/01/18 2357  AST 22  ALT 19  ALKPHOS 81  BILITOT 0.9  PROT 7.5  ALBUMIN 3.8   No results for input(s): LIPASE, AMYLASE in the last 168 hours. No results for input(s): AMMONIA in the last 168 hours. Coagulation Profile: No results for input(s): INR, PROTIME in the last 168 hours. Cardiac Enzymes: No results for input(s): CKTOTAL, CKMB, CKMBINDEX, TROPONINI in the last 168 hours. BNP (last 3 results) No results for input(s): PROBNP in the last 8760 hours. HbA1C: No  results for input(s): HGBA1C in the last 72 hours. CBG: No results for input(s): GLUCAP in the last 168 hours. Lipid Profile: No results for input(s): CHOL, HDL, LDLCALC, TRIG, CHOLHDL, LDLDIRECT in the last 72 hours. Thyroid Function Tests: No results for input(s): TSH, T4TOTAL, FREET4, T3FREE, THYROIDAB in the last 72 hours. Anemia Panel: No results for input(s): VITAMINB12, FOLATE, FERRITIN, TIBC, IRON, RETICCTPCT in the last 72 hours. Urine analysis:    Component Value Date/Time   COLORURINE YELLOW 03/02/2018 0401   APPEARANCEUR CLEAR 03/02/2018 0401   LABSPEC 1.026 03/02/2018 0401   PHURINE 6.0 03/02/2018 0401   GLUCOSEU >=500 (A) 03/02/2018 0401   HGBUR NEGATIVE 03/02/2018 0401   BILIRUBINUR NEGATIVE 03/02/2018 0401   KETONESUR NEGATIVE 03/02/2018 0401   PROTEINUR NEGATIVE 03/02/2018 0401   NITRITE NEGATIVE 03/02/2018 0401   LEUKOCYTESUR NEGATIVE 03/02/2018 0401    Radiological Exams on Admission: Dg Chest 2 View  Result Date: 03/01/2018 CLINICAL DATA:   Cellulitis of both legs status post 2 rounds of oral antibiotics without improvement. EXAM: CHEST - 2 VIEW COMPARISON:  None. FINDINGS: The heart size and mediastinal contours are within normal limits. Both lungs are clear. The visualized skeletal structures are unremarkable. IMPRESSION: No active cardiopulmonary disease. Electronically Signed   By: Ashley Royalty M.D.   On: 03/01/2018 23:00    EKG: Independently reviewed.  Sinus rhythm (heart rate 74), no prior EKG for comparison.  Assessment/Plan Principal Problem:   Cellulitis Active Problems:   Weight gain   Type 2 diabetes mellitus (HCC)   Diabetic neuropathy (HCC)   Hypothyroidism   Chronic anemia   HTN (hypertension)   Depression   Mood disorder (HCC)   HLD (hyperlipidemia)   GERD (gastroesophageal reflux disease)   Asthma   Cellulitis of bilateral lower extremities Patient is afebrile and HDS. No leukocytosis and lactic acid level normal. Noted to have bilateral lower extremity edema, erythema, and increased warmth. -Allergic to penicillins.  Continue Vancomycin.  -Blood cx x2 pending  Recent weight gain Patient endorses 22 lbs weight gain. Denies having any dyspnea, orthopnea, or PND. BNP normal. CXR without signs of volume overload. Bilateral lower extremity edema possibly related to venous stasis.  -Has a hx of hypothyroidism, will check TSH level   Type 2 diabetes Blood glucose 144. -Check A1c -SSI-S -CBH checks  Diabetic neuropathy -Continue home gabapentin  Hypothyroidism -Check TSH level -Continue home levothyroxine   Chronic anemia -Hgb 12.8, stable.   HTN -Continue home metoprolol -IV hydralazine 5 mg every 4 hours as needed for SBP >160 -Hold home ARB at this time  Depression/ mood disorder -Continue home abilify, perphenazine, Zoloft, Topamax, trazodone   HLD -Continue home Lipitor   GERD -Continue PPI  Asthma  -Stable. No bronchospasm. Continue home inhaler.   Patient reports history of  A. Fib Pradaxa listed in the patient's home medications.  I spoke to him and he told me that he has not taken this medication for several years.  States he was previously told that he had A. fib and was on Pradaxa but then had a cardioversion.  States he stopped taking the medication because his insurance stopped paying for it.  He has not followed up with a cardiologist since then.  No records in the chart. -EKG at present showing sinus rhythm.   DVT prophylaxis: Lovenox Code Status: Patient wishes to be full code. Family Communication: No family available. Disposition Plan: Anticipate discharge to home in 1 to 2 days. Consults called: None Admission status:  Observation  Shela Leff MD [facility] Pager [phone]  If 7PM-7AM, please contact night-coverage [email or web address] Password [email or web address]  03/02/2018, 5:56 AM

## 2018-03-02 NOTE — ED Notes (Signed)
ED TO INPATIENT HANDOFF REPORT  Name/Age/Gender Antonio Andrade 67 y.o. male  Code Status    Code Status Orders  (From admission, onward)         Start     Ordered   03/02/18 0537  Full code  Continuous     03/02/18 0538        Code Status History    This patient has a current code status but no historical code status.      Home/SNF/Other Home  Chief Complaint iv antibiotics  Level of Care/Admitting Diagnosis ED Disposition    ED Disposition Condition Comment   Admit  Hospital Area: [facility] [[id number]]  Level of Care: Med-Surg [16]  Diagnosis: Cellulitis [220254]  Admitting Physician: Shela Leff [[id number]]  Attending Physician: Shela Leff [[id number]]  PT Class (Do Not Modify): Observation [104]  PT Acc Code (Do Not Modify): Observation [10022]       Medical History Past Medical History:  Diagnosis Date  . Asthma   . Diabetes mellitus without complication ([place])   . Hypertension     Allergies Allergies  Allergen Reactions  . Amoxicillin Nausea Only    Has patient had a PCN reaction causing immediate rash, facial/tongue/throat swelling, SOB or lightheadedness with hypotension:No Has patient had a PCN reaction causing severe rash involving mucus membranes or skin necrosis: No Has patient had a PCN reaction that required hospitalization: No Has patient had a PCN reaction occurring within the last 10 years: No If all of the above answers are "NO", then may proceed with Cephalosporin use.   Marland Kitchen Penicillin G Nausea And Vomiting    Has patient had a PCN reaction causing immediate rash, facial/tongue/throat swelling, SOB or lightheadedness with hypotension: UNKNOWN Has patient had a PCN reaction causing severe rash involving mucus membranes or skin necrosis: Unknown Has patient had a PCN reaction that required hospitalization: Unknown Has patient had a PCN reaction occurring within the last 10 years: Unknown If all of the above  answers are "NO", then may proceed with Cephalosporin use.   . Sulfa Antibiotics Itching  . Sulfamethoxazole Itching    IV Location/Drains/Wounds Patient Lines/Drains/Airways Status   Active Line/Drains/Airways    Name:   Placement date:   Placement time:   Site:   Days:   Peripheral IV 03/02/18 Left Forearm   03/02/18    0335    Forearm   less than 1          Labs/Imaging Results for orders placed or performed during the hospital encounter of 03/01/18 (from the past 48 hour(s))  Blood culture (routine x 2)     Status: None (Preliminary result)   Collection Time: 03/01/18 11:40 PM  Result Value Ref Range   Specimen Description      BLOOD LEFT FOREARM Performed at [facility] [address] [place], [place] [address]    Special Requests      BOTTLES DRAWN AEROBIC AND ANAEROBIC Blood Culture adequate volume Performed at [facility] [address], [place], [place] [address]    Culture PENDING    Report Status PENDING   CBC with Differential     Status: Abnormal   Collection Time: 03/01/18 11:57 PM  Result Value Ref Range   WBC 8.9 4.0 - 10.5 K/uL   RBC 4.72 4.22 - 5.81 MIL/uL   Hemoglobin 12.6 (L) 13.0 - 17.0 g/dL   HCT 42.2 39.0 - 52.0 %   MCV 89.4 80.0 - 100.0 fL  MCH 26.7 26.0 - 34.0 pg   MCHC 29.9 (L) 30.0 - 36.0 g/dL   RDW 16.8 (H) 11.5 - 15.5 %   Platelets 266 150 - 400 K/uL   nRBC 0.0 0.0 - 0.2 %   Neutrophils Relative % 62 %   Neutro Abs 5.5 1.7 - 7.7 K/uL   Lymphocytes Relative 22 %   Lymphs Abs 2.0 0.7 - 4.0 K/uL   Monocytes Relative 7 %   Monocytes Absolute 0.7 0.1 - 1.0 K/uL   Eosinophils Relative 8 %   Eosinophils Absolute 0.7 (H) 0.0 - 0.5 K/uL   Basophils Relative 1 %   Basophils Absolute 0.1 0.0 - 0.1 K/uL   Immature Granulocytes 0 %   Abs Immature Granulocytes 0.04 0.00 - 0.07 K/uL    Comment: Performed at [facility], [place] [address], [place], [place] [address]  Comprehensive metabolic panel      Status: Abnormal   Collection Time: 03/01/18 11:57 PM  Result Value Ref Range   Sodium 140 135 - 145 mmol/L   Potassium 4.1 3.5 - 5.1 mmol/L   Chloride 109 98 - 111 mmol/L   CO2 24 22 - 32 mmol/L   Glucose, Bld 144 (H) 70 - 99 mg/dL   BUN 12 8 - 23 mg/dL   Creatinine, Ser 1.11 0.61 - 1.24 mg/dL   Calcium 8.6 (L) 8.9 - 10.3 mg/dL   Total Protein 7.5 6.5 - 8.1 g/dL   Albumin 3.8 3.5 - 5.0 g/dL   AST 22 15 - 41 U/L   ALT 19 0 - 44 U/L   Alkaline Phosphatase 81 38 - 126 U/L   Total Bilirubin 0.9 0.3 - 1.2 mg/dL   GFR calc non Af Amer >60 >60 mL/min   GFR calc Af Amer >60 >60 mL/min    Comment: (NOTE) The eGFR has been calculated using the CKD EPI equation. This calculation has not been validated in all clinical situations. eGFR's persistently <60 mL/min signify possible Chronic Kidney Disease.    Anion gap 7 5 - 15    Comment: Performed at [facility], [place] [address] [place], [place] [address]  Brain natriuretic peptide     Status: None   Collection Time: 03/01/18 11:57 PM  Result Value Ref Range   B Natriuretic Peptide 18.9 0.0 - 100.0 pg/mL    Comment: Performed at [facility], [place] [address], [place], [place] [address]  I-Stat CG4 Lactic Acid, ED     Status: None   Collection Time: 03/02/18  2:00 AM  Result Value Ref Range   Lactic Acid, Venous 1.47 0.5 - 1.9 mmol/L  Urinalysis, Routine w reflex microscopic     Status: Abnormal   Collection Time: 03/02/18  4:01 AM  Result Value Ref Range   Color, Urine YELLOW YELLOW   APPearance CLEAR CLEAR   Specific Gravity, Urine 1.026 1.005 - 1.030   pH 6.0 5.0 - 8.0   Glucose, UA >=500 (A) NEGATIVE mg/dL   Hgb urine dipstick NEGATIVE NEGATIVE   Bilirubin Urine NEGATIVE NEGATIVE   Ketones, ur NEGATIVE NEGATIVE mg/dL   Protein, ur NEGATIVE NEGATIVE mg/dL   Nitrite NEGATIVE NEGATIVE   Leukocytes, UA NEGATIVE NEGATIVE   RBC / HPF 0-5 0 - 5 RBC/hpf   WBC, UA 0-5 0 - 5 WBC/hpf   Bacteria, UA  NONE SEEN NONE SEEN   Mucus PRESENT     Comment: Performed at [facility], [place] [address]  [place], [place] [address]   Dg Chest 2 View  Result Date: 03/01/2018 CLINICAL DATA:  Cellulitis of both legs status post 2 rounds of oral antibiotics without improvement. EXAM: CHEST - 2 VIEW COMPARISON:  None. FINDINGS: The heart size and mediastinal contours are within normal limits. Both lungs are clear. The visualized skeletal structures are unremarkable. IMPRESSION: No active cardiopulmonary disease. Electronically Signed   By: Ashley Royalty M.D.   On: 03/01/2018 23:00   EKG Interpretation  Date/Time:  Wednesday March 01 2018 23:53:24 EST Ventricular Rate:  74 PR Interval:    QRS Duration: 105 QT Interval:  404 QTC Calculation: 449 R Axis:   52 Text Interpretation:  Sinus rhythm Low voltage, precordial leads Probable anteroseptal infarct, old No previous ECGs available Confirmed by Gareth Morgan [id number]) on 03/01/2018 11:56:09 PM   Pending Labs Unresulted Labs (From admission, onward)    Start     Ordered   03/02/18 0542  Hemoglobin A1c  Once,   R     03/02/18 0541   03/02/18 0542  TSH  Once,   R     03/02/18 0541   03/02/18 0536  HIV antibody (Routine Testing)  Once,   R     03/02/18 0538   03/02/18 0359  Urine culture  ONCE - STAT,   STAT     03/02/18 0400   03/01/18 2232  Blood culture (routine x 2)  BLOOD CULTURE X 2,   STAT     03/01/18 2233          Vitals/Pain Today's Vitals   03/02/18 0400 03/02/18 0500 03/02/18 0530 03/02/18 0600  BP: (!) 121/55 131/72 (!) 144/60 131/71  Pulse:  66 65   Resp: '16 17 17 16  '$ Temp:      TempSrc:      SpO2:  96% 96%   Weight:      Height:        Isolation Precautions No active isolations  Medications Medications  acetaminophen (TYLENOL) tablet 650 mg (has no administration in time range)  atorvastatin (LIPITOR) tablet 40 mg (has no administration in time range)  metoprolol succinate (TOPROL-XL) 24 hr  tablet 200 mg (has no administration in time range)  ARIPiprazole (ABILIFY) tablet 2 mg (has no administration in time range)  perphenazine (TRILAFON) tablet 2 mg (has no administration in time range)  sertraline (ZOLOFT) tablet 100 mg (has no administration in time range)  traZODone (DESYREL) tablet 100 mg (has no administration in time range)  levothyroxine (SYNTHROID, LEVOTHROID) tablet 75 mcg (has no administration in time range)  pantoprazole (PROTONIX) EC tablet 40 mg (has no administration in time range)  gabapentin (NEURONTIN) capsule 300 mg (has no administration in time range)  topiramate (TOPAMAX) tablet 100 mg (has no administration in time range)  albuterol (PROVENTIL HFA;VENTOLIN HFA) 108 (90 Base) MCG/ACT inhaler 1-2 puff (has no administration in time range)  enoxaparin (LOVENOX) injection 40 mg (has no administration in time range)  insulin aspart (novoLOG) injection 0-9 Units (has no administration in time range)  vancomycin (VANCOCIN) 2,500 mg in sodium chloride 0.9 % 500 mL IVPB (2,500 mg Intravenous New Bag/Given 03/02/18 0643)  hydrALAZINE (APRESOLINE) injection 5 mg (has no administration in time range)  vancomycin (VANCOCIN) 1,750 mg in sodium chloride 0.9 % 500 mL IVPB (has no administration in time range)  0.9 %  sodium chloride infusion (500 mLs Intravenous New Bag/Given 03/02/18 0634)  acetaminophen (TYLENOL) tablet 650 mg (650 mg Oral Given 03/01/18 2315)  Mobility walks

## 2018-03-02 NOTE — Progress Notes (Signed)
PROGRESS NOTE                                                                                                                                                                                                             Patient Demographics:    Antonio Andrade, is a 67 y.o. male, DOB - Feb 10, 1951, DEY:[id number]  Admit date - 03/01/2018   Admitting Physician Shela Leff, MD  Outpatient Primary MD for the patient is London Pepper, MD  LOS - 0  Outpatient Specialists: None  Chief Complaint  Patient presents with  . Cellulitis       Brief Narrative 67 year old obese male with history of hypertension, diabetes mellitus on metformin and asthma presenting with bilateral lower leg swelling with erythema.  Symptoms started about 9 weeks back with progression.  Reports some discomfort in his calves.  Reports seeing his PCP few weeks back and was prescribed clindamycin which she was taking until couple of days before admission without any help.  Denies any fever, insect bite or trauma to the legs.  Reports gaining almost 22 pounds in the past 4 weeks and having a sedentary lifestyle.  Reports some dyspnea on exertion but no orthopnea or PND. Chest x-ray and BNP on presentation were unremarkable.  Patient placed on observation for IV antibiotics with failed outpatient therapy.   Subjective:   Still having leg swellings and redness.  Pain better.   Assessment  & Plan :    Principal Problem: Bilateral lower extremity cellulitis. Failed outpatient therapy with clindamycin.  On empiric IV vancomycin (patient allergic to penicillin).  Keep leg elevated.  PRN Tylenol for pain.  Doppler lower extremity negative for DVT.  Active Problems: Leg edema with weight gain Reports 20 pound weight gain in the past 4 weeks.  BNP unremarkable.  Does report dyspnea on exertion and remote history of CHF.  I will obtain a 2D echo to evaluate his EF  and any wall motion abnormality. Given a dose of IV Lasix 60 mg.    Type 2 diabetes mellitus, controlled, with diabetic neuropathy ([place]) On metformin at home.  CBG stable.  Monitor with sliding scale coverage.  Continue gabapentin.  Hypothyroidism TSH of 9.2.  Increase Synthroid dose to 100 mcg.  Essential hypertension Resume home meds.  Mood disorder/  depression Resume home meds  Dyslipidemia Continue statin.  Code Status : Full code  Family Communication  : None at bedside  Disposition Plan  : Home possibly in 1-2 days if improved  Barriers For Discharge : Active symptoms  Consults  : None  Procedures  : Doppler lower extremities  DVT Prophylaxis  :  Lovenox -   Lab Results  Component Value Date   PLT 266 03/01/2018    Antibiotics  :    Anti-infectives (From admission, onward)   Start     Dose/Rate Route Frequency Ordered Stop   03/03/18 0800  vancomycin (VANCOCIN) 1,750 mg in sodium chloride 0.9 % 500 mL IVPB     1,750 mg 250 mL/hr over 120 Minutes Intravenous Every 24 hours 03/02/18 0558     03/02/18 0600  ceFAZolin (ANCEF) IVPB 2g/100 mL premix  Status:  Discontinued     2 g 200 mL/hr over 30 Minutes Intravenous Every 8 hours 03/02/18 0538 03/02/18 0538   03/02/18 0600  vancomycin (VANCOCIN) 2,500 mg in sodium chloride 0.9 % 500 mL IVPB     2,500 mg 250 mL/hr over 120 Minutes Intravenous  Once 03/02/18 0551 03/02/18 0843        Objective:   Vitals:   03/02/18 0600 03/02/18 0708 03/02/18 0904 03/02/18 1023  BP: 131/71 (!) 141/75 (!) 144/77 (!) 143/80  Pulse:  60 60 72  Resp: 16 18 18    Temp:  97.7 F (36.5 C) 97.6 F (36.4 C) 97.7 F (36.5 C)  TempSrc:  Oral Oral Oral  SpO2:  100% 100% 98%  Weight:      Height:        Wt Readings from Last 3 Encounters:  03/01/18 132 kg     Intake/Output Summary (Last 24 hours) at 03/02/2018 1240 Last data filed at 03/02/2018 1000 Gross per 24 hour  Intake 480 ml  Output 525 ml  Net -45 ml       Physical Exam  Gen: not in distress HEENT: no pallor, moist mucosa, supple neck Chest: clear b/l, no added sounds CVS: N S1&S2, no murmurs, rubs or gallop GI: soft, NT, ND, BS+ Musculoskeletal: Warmth with swelling over bilateral lower extremity with erythema and pitting.  Mild tenderness     Data Review:    CBC Recent Labs  Lab 03/01/18 2357  WBC 8.9  HGB 12.6*  HCT 42.2  PLT 266  MCV 89.4  MCH 26.7  MCHC 29.9*  RDW 16.8*  LYMPHSABS 2.0  MONOABS 0.7  EOSABS 0.7*  BASOSABS 0.1    Chemistries  Recent Labs  Lab 03/01/18 2357  NA 140  K 4.1  CL 109  CO2 24  GLUCOSE 144*  BUN 12  CREATININE 1.11  CALCIUM 8.6*  AST 22  ALT 19  ALKPHOS 81  BILITOT 0.9   ------------------------------------------------------------------------------------------------------------------ No results for input(s): CHOL, HDL, LDLCALC, TRIG, CHOLHDL, LDLDIRECT in the last 72 hours.  Lab Results  Component Value Date   HGBA1C 6.9 (H) 03/02/2018   ------------------------------------------------------------------------------------------------------------------ Recent Labs    03/02/18 0805  TSH 9.229*   ------------------------------------------------------------------------------------------------------------------ No results for input(s): VITAMINB12, FOLATE, FERRITIN, TIBC, IRON, RETICCTPCT in the last 72 hours.  Coagulation profile No results for input(s): INR, PROTIME in the last 168 hours.  No results for input(s): DDIMER in the last 72 hours.  Cardiac Enzymes No results for input(s): CKMB, TROPONINI, MYOGLOBIN in the last 168 hours.  Invalid input(s): CK ------------------------------------------------------------------------------------------------------------------    Component Value Date/Time   BNP 18.9 03/01/2018 2357    Inpatient Medications  Scheduled Meds: . ARIPiprazole  2 mg Oral Daily  . atorvastatin  40 mg Oral Daily  . [START ON 03/03/2018]  enoxaparin (LOVENOX) injection  0.5 mg/kg Subcutaneous Q24H  . gabapentin  300 mg Oral BID  . insulin aspart  0-9 Units Subcutaneous TID WC  . levothyroxine  75 mcg Oral Q0600  . metoprolol  200 mg Oral Daily  . pantoprazole  40 mg Oral Daily  . perphenazine  2 mg Oral QHS  . sertraline  100 mg Oral Daily  . topiramate  100 mg Oral QHS  . traZODone  100 mg Oral QHS   Continuous Infusions: . sodium chloride 500 mL (03/02/18 0634)  . [START ON 03/03/2018] vancomycin     PRN Meds:.sodium chloride, acetaminophen, albuterol, hydrALAZINE  Micro Results Recent Results (from the past 240 hour(s))  Blood culture (routine x 2)     Status: None (Preliminary result)   Collection Time: 03/01/18 11:40 PM  Result Value Ref Range Status   Specimen Description   Final    BLOOD LEFT FOREARM Performed at [facility], [place] [address], [employer], [place] [address]    Special Requests   Final    BOTTLES DRAWN AEROBIC AND ANAEROBIC Blood Culture adequate volume Performed at [facility] [address], [place],  [address]    Culture PENDING  Incomplete   Report Status PENDING  Incomplete    Radiology Reports Dg Chest 2 View  Result Date: 03/01/2018 CLINICAL DATA:  Cellulitis of both legs status post 2 rounds of oral antibiotics without improvement. EXAM: CHEST - 2 VIEW COMPARISON:  None. FINDINGS: The heart size and mediastinal contours are within normal limits. Both lungs are clear. The visualized skeletal structures are unremarkable. IMPRESSION: No active cardiopulmonary disease. Electronically Signed   By: Ashley Royalty M.D.   On: 03/01/2018 23:00    Time Spent in minutes  20   Kinzly Pierrelouis M.D on 03/02/2018 at 12:40 PM  Between 7am to 7pm - Pager - [phone]  After 7pm go to [email or web address] - password [email or web address]  [facility] -  Office  [phone]

## 2018-03-02 NOTE — Progress Notes (Signed)
LE venous duplex prelim: negative for DVT. Antonio Andrade, RDMS, RVT  

## 2018-03-02 NOTE — Progress Notes (Signed)
  Echocardiogram 2D Echocardiogram has been performed.  Charleton Deyoung L Androw 03/02/2018, 2:29 PM

## 2018-03-03 DIAGNOSIS — E119 Type 2 diabetes mellitus without complications: Secondary | ICD-10-CM

## 2018-03-03 DIAGNOSIS — I5032 Chronic diastolic (congestive) heart failure: Secondary | ICD-10-CM | POA: Diagnosis not present

## 2018-03-03 DIAGNOSIS — E118 Type 2 diabetes mellitus with unspecified complications: Secondary | ICD-10-CM | POA: Diagnosis not present

## 2018-03-03 DIAGNOSIS — L03115 Cellulitis of right lower limb: Secondary | ICD-10-CM | POA: Diagnosis not present

## 2018-03-03 DIAGNOSIS — E039 Hypothyroidism, unspecified: Secondary | ICD-10-CM | POA: Diagnosis not present

## 2018-03-03 DIAGNOSIS — L03116 Cellulitis of left lower limb: Secondary | ICD-10-CM | POA: Diagnosis present

## 2018-03-03 LAB — URINE CULTURE: CULTURE: NO GROWTH

## 2018-03-03 LAB — GLUCOSE, CAPILLARY
Glucose-Capillary: 134 mg/dL — ABNORMAL HIGH (ref 70–99)
Glucose-Capillary: 146 mg/dL — ABNORMAL HIGH (ref 70–99)

## 2018-03-03 LAB — CREATININE, SERUM
CREATININE: 1.38 mg/dL — AB (ref 0.61–1.24)
GFR, EST AFRICAN AMERICAN: 59 mL/min — AB (ref >60)
GFR, EST NON AFRICAN AMERICAN: 51 mL/min — AB (ref >60)

## 2018-03-03 MED ORDER — CEPHALEXIN 500 MG PO CAPS: 500.0000 mg | ORAL_CAPSULE | Freq: Four times a day (QID) | ORAL | 0 refills | Status: DC

## 2018-03-03 MED ORDER — VANCOMYCIN HCL IN DEXTROSE 1-5 GM/200ML-% IV SOLN
1000.0000 mg | Freq: Once | INTRAVENOUS | Status: AC
  Administered 2018-03-03: 1000 mg via INTRAVENOUS
  Filled 2018-03-03: qty 200

## 2018-03-03 MED ORDER — DOXYCYCLINE HYCLATE 100 MG PO TABS: 100.0000 mg | ORAL_TABLET | Freq: Two times a day (BID) | ORAL | 0 refills | Status: DC

## 2018-03-03 MED ORDER — LEVOTHYROXINE SODIUM 100 MCG PO TABS: 100.0000 ug | ORAL_TABLET | Freq: Every day | ORAL | 0 refills | Status: DC

## 2018-03-03 MED ORDER — VANCOMYCIN HCL 10 G IV SOLR
1750.0000 mg | INTRAVENOUS | Status: DC
  Filled 2018-03-03: qty 1750

## 2018-03-03 MED ORDER — SACCHAROMYCES BOULARDII 250 MG PO CAPS: 250.0000 mg | ORAL_CAPSULE | Freq: Two times a day (BID) | ORAL | 0 refills | Status: DC

## 2018-03-03 NOTE — Discharge Summary (Addendum)
Physician Discharge Summary  Antonio Andrade DDU:[id number] DOB: 1951-03-04 DOA: 03/01/2018  PCP: London Pepper, MD  Admit date: 03/01/2018 Discharge date: 03/03/2018  Admitted From: Home Disposition: Home  Recommendations for Outpatient Follow-up:  1. Follow up with PCP in 1-2 weeks.  Patient is being discharged on 8 days of oral doxycycline to complete total 10-day course of antibiotic (until 11/23). 2. Please check renal function during outpatient follow-up.  If he still has leg swellings may add low-dose Lasix.  Home Health: None Equipment/Devices: None  Discharge Condition: Fair CODE STATUS: Full code Diet recommendation: Heart Healthy / Carb Modified    Discharge Diagnoses:  Principal Problem:   Cellulitis of left lower leg  Active Problems:   Weight gain   Diabetic neuropathy (HCC)   Hypothyroidism   Chronic anemia   HTN (hypertension)   Depression   Mood disorder (HCC)   HLD (hyperlipidemia)   GERD (gastroesophageal reflux disease)   Asthma   Type 2 diabetes mellitus, controlled (HCC)   Chronic diastolic CHF (congestive heart failure) (HCC)  Brief Narrative/ HPI 67 year old obese male with history of hypertension, diabetes mellitus on metformin and asthma presenting with bilateral lower leg swelling with erythema.  Symptoms started about 9 weeks back with progression.  Reports some discomfort in his calves.  Reports seeing his PCP few weeks back and was prescribed clindamycin which she was taking until couple of days before admission without any help.  Denies any fever, insect bite or trauma to the legs.  Reports gaining almost 22 pounds in the past 4 weeks and having a sedentary lifestyle.  Reports some dyspnea on exertion but no orthopnea or PND. Chest x-ray and BNP on presentation were unremarkable.  Patient placed on observation for IV antibiotics with failed outpatient therapy.   Hospital course  Principal Problem: Bilateral lower extremity cellulitis. Failed  outpatient therapy on Keflex and?  Cipro.   Placed on empiric IV vancomycin with improvement.  Doppler lower extremity negative for DVT. We will discharge him on oral doxycycline to complete 10-day course of antibiotic.  Florastor prescribed given prolonged use of antibiotic recently.  Active Problems: Leg edema with weight gain Reports 20 pound weight gain in the past 4 weeks.  BNP unremarkable.  Does report dyspnea on exertion and remote history of CHF.  2D echo shows normal EF with grade 2 diastolic dysfunction.  I suspect he has chronic underlying diastolic CHF.  Does not appear to be in acute volume overload.  BNP normal and chest x-ray unremarkable.  Leg swelling has improved with antibiotic and a dose of IV Lasix given yesterday.  His renal function is worsening so I would not place him on scheduled Lasix. Should follow-up with his PCP.  Given a dose of IV Lasix 60 mg.    Type 2 diabetes mellitus, controlled, with diabetic neuropathy ([place]) On metformin, Januvia and Invokana at home.  CBG stable.  Continue gabapentin.  Follow-up with PCP and monitor renal function.  Hypothyroidism TSH of 9.2.  Increased Synthroid dose to 100 mcg.  Essential hypertension Resume home meds.  Mood disorder/  depression Resume home meds.  Dyslipidemia Continue statin.  Acute kidney injury Mild.  Received a dose of IV Lasix during hospital stay and IV vancomycin.  Patient discharged back on metformin and ARB.  Monitor renal function closely during outpatient follow-up..   Family Communication  : None at bedside  Disposition Plan  : Home   Consults  : None  Procedures  : Doppler lower extremities, 2D  ECHO  Discharge Instructions   Allergies as of 03/03/2018      Reactions   Amoxicillin Nausea Only   Has patient had a PCN reaction causing immediate rash, facial/tongue/throat swelling, SOB or lightheadedness with hypotension:No Has patient had a PCN reaction causing severe rash  involving mucus membranes or skin necrosis: No Has patient had a PCN reaction that required hospitalization: No Has patient had a PCN reaction occurring within the last 10 years: No If all of the above answers are "NO", then may proceed with Cephalosporin use.   Penicillin G Nausea And Vomiting   Has patient had a PCN reaction causing immediate rash, facial/tongue/throat swelling, SOB or lightheadedness with hypotension: UNKNOWN Has patient had a PCN reaction causing severe rash involving mucus membranes or skin necrosis: Unknown Has patient had a PCN reaction that required hospitalization: Unknown Has patient had a PCN reaction occurring within the last 10 years: Unknown If all of the above answers are "NO", then may proceed with Cephalosporin use.   Sulfa Antibiotics Itching   Sulfamethoxazole Itching      Medication List    STOP taking these medications   ciprofloxacin 500 MG tablet Commonly known as:  CIPRO cephALEXin 500 MG capsule Commonly known as:  KEFLEX     TAKE these medications   ARIPiprazole 2 MG tablet Commonly known as:  ABILIFY Take 2 mg by mouth daily.   atorvastatin 40 MG tablet Commonly known as:  LIPITOR Take 40 mg by mouth daily.      Clobetasol Propionate 0.05 % external spray Commonly known as:  TEMOVATE Apply 1 application topically daily as needed for rash.   doxycycline 100 MG tablet Commonly known as:  VIBRA-TABS Take 1 tablet (100 mg total) by mouth 2 (two) times daily for 8 days.   gabapentin 300 MG capsule Commonly known as:  NEURONTIN Take 300 mg by mouth 2 (two) times daily.   INVOKANA 300 MG Tabs tablet Generic drug:  canagliflozin Take 300 mg by mouth daily.   JANUVIA 100 MG tablet Generic drug:  sitaGLIPtin Take 100 mg by mouth daily.   levothyroxine 100 MCG tablet Commonly known as:  SYNTHROID, LEVOTHROID Take 1 tablet (100 mcg total) by mouth daily. What changed:    medication strength  how much to take   metFORMIN  1000 MG tablet Commonly known as:  GLUCOPHAGE Take 1,000 mg by mouth 2 (two) times daily.   metoprolol 200 MG 24 hr tablet Commonly known as:  TOPROL-XL Take 200 mg by mouth daily.   omeprazole 20 MG capsule Commonly known as:  PRILOSEC Take 20 mg by mouth daily.   perphenazine 2 MG tablet Commonly known as:  TRILAFON Take 2 mg by mouth at bedtime.   PROAIR HFA 108 (90 Base) MCG/ACT inhaler Generic drug:  albuterol Inhale 2-4 puffs into the lungs. Every 4 to 6 hours as needed for wheezing or shortness of breath   saccharomyces boulardii 250 MG capsule Commonly known as:  FLORASTOR Take 1 capsule (250 mg total) by mouth 2 (two) times daily.   sertraline 100 MG tablet Commonly known as:  ZOLOFT Take 100 mg by mouth daily.   telmisartan 80 MG tablet Commonly known as:  MICARDIS Take 80 mg by mouth daily.   topiramate 100 MG tablet Commonly known as:  TOPAMAX Take 100 mg by mouth at bedtime.   traZODone 100 MG tablet Commonly known as:  DESYREL Take 100 mg by mouth at bedtime.      Follow-up  Information    London Pepper, MD. Schedule an appointment as soon as possible for a visit in 1 week(s).   Specialty:  Family Medicine Contact information: [address] [place] [address] [phone]          Allergies  Allergen Reactions  . Amoxicillin Nausea Only    Has patient had a PCN reaction causing immediate rash, facial/tongue/throat swelling, SOB or lightheadedness with hypotension:No Has patient had a PCN reaction causing severe rash involving mucus membranes or skin necrosis: No Has patient had a PCN reaction that required hospitalization: No Has patient had a PCN reaction occurring within the last 10 years: No If all of the above answers are "NO", then may proceed with Cephalosporin use.   Marland Kitchen Penicillin G Nausea And Vomiting    Has patient had a PCN reaction causing immediate rash, facial/tongue/throat swelling, SOB or lightheadedness  with hypotension: UNKNOWN Has patient had a PCN reaction causing severe rash involving mucus membranes or skin necrosis: Unknown Has patient had a PCN reaction that required hospitalization: Unknown Has patient had a PCN reaction occurring within the last 10 years: Unknown If all of the above answers are "NO", then may proceed with Cephalosporin use.   . Sulfa Antibiotics Itching  . Sulfamethoxazole Itching      Procedures/Studies: Dg Chest 2 View  Result Date: 03/01/2018 CLINICAL DATA:  Cellulitis of both legs status post 2 rounds of oral antibiotics without improvement. EXAM: CHEST - 2 VIEW COMPARISON:  None. FINDINGS: The heart size and mediastinal contours are within normal limits. Both lungs are clear. The visualized skeletal structures are unremarkable. IMPRESSION: No active cardiopulmonary disease. Electronically Signed   By: Ashley Royalty M.D.   On: 03/01/2018 23:00   Vas [place] Lower Extremity Venous (dvt)  Result Date: 03/02/2018  Lower Venous Study Indications: Edema.  Limitations: Body habitus. Performing Technologist: Landry Mellow RDMS, RVT  Examination Guidelines: A complete evaluation includes B-mode imaging, spectral Doppler, color Doppler, and power Doppler as needed of all accessible portions of each vessel. Bilateral testing is considered an integral part of a complete examination. Limited examinations for reoccurring indications may be performed as noted.  Right Venous Findings: +---------+---------------+---------+-----------+----------+-------------------+          CompressibilityPhasicitySpontaneityPropertiesSummary             +---------+---------------+---------+-----------+----------+-------------------+ CFV      Full           Yes      Yes                  limited                                                                   visualization due                                                         to pannus            +---------+---------------+---------+-----------+----------+-------------------+ SFJ      Full                                                             +---------+---------------+---------+-----------+----------+-------------------+  FV Prox  Full                                                             +---------+---------------+---------+-----------+----------+-------------------+ FV Mid   Full                                                             +---------+---------------+---------+-----------+----------+-------------------+ FV DistalFull                                                             +---------+---------------+---------+-----------+----------+-------------------+ PFV      Full                                                             +---------+---------------+---------+-----------+----------+-------------------+ POP      Full           Yes      Yes                                      +---------+---------------+---------+-----------+----------+-------------------+ PTV      Full                                                             +---------+---------------+---------+-----------+----------+-------------------+ PERO     Full                                                             +---------+---------------+---------+-----------+----------+-------------------+  Left Venous Findings: +---------+---------------+---------+-----------+----------+-------------------+          CompressibilityPhasicitySpontaneityPropertiesSummary             +---------+---------------+---------+-----------+----------+-------------------+ CFV      Full           Yes      Yes                  limited                                                                   visualization due  to pannus            +---------+---------------+---------+-----------+----------+-------------------+ SFJ      Full                                                             +---------+---------------+---------+-----------+----------+-------------------+ FV Prox  Full                                                             +---------+---------------+---------+-----------+----------+-------------------+ FV Mid   Full                                                             +---------+---------------+---------+-----------+----------+-------------------+ FV DistalFull                                                             +---------+---------------+---------+-----------+----------+-------------------+ PFV      Full                                                             +---------+---------------+---------+-----------+----------+-------------------+ POP      Full           Yes      Yes                                      +---------+---------------+---------+-----------+----------+-------------------+ PTV      Full                                                             +---------+---------------+---------+-----------+----------+-------------------+ PERO     Full                                                             +---------+---------------+---------+-----------+----------+-------------------+    Summary: Right: There is no evidence of deep vein thrombosis in the lower extremity. However, portions of this examination were limited- see technologist comments above. No cystic structure found in the popliteal fossa. Left: There is no evidence of deep vein thrombosis in the lower extremity. However, portions of this examination were limited- see technologist comments  above. No cystic structure found in the popliteal fossa.  *See table(s) above for measurements and observations. Electronically signed by Monica Martinez MD on 03/02/2018 at 4:32:26 PM.    Final      (Echo, Carotid, EGD, Colonoscopy, ERCP)    Subjective:   Discharge Exam: Vitals:   03/02/18 2156 03/03/18 0524  BP: 138/74 (!) 106/59  Pulse: (!) 55 (!) 54  Resp: 20 20  Temp: 98.2 F (36.8 C) 98.3 F (36.8 C)  SpO2: 96% 93%   Vitals:   03/02/18 1023 03/02/18 1428 03/02/18 2156 03/03/18 0524  BP: (!) 143/80 101/60 138/74 (!) 106/59  Pulse: 72 (!) 57 (!) 55 (!) 54  Resp:  16 20 20   Temp: 97.7 F (36.5 C) 98.3 F (36.8 C) 98.2 F (36.8 C) 98.3 F (36.8 C)  TempSrc: Oral Oral Oral Oral  SpO2: 98% 96% 96% 93%  Weight:      Height:        General: Not in distress HEENT: Moist mucosa, supple neck Chest: Clear bilaterally CVS: Normal S1-S2, no murmurs GI: Soft, nondistended, nontender Musculoskeletal: Erythema over bilateral leg improving, swelling improved as well, nontender    The results of significant diagnostics from this hospitalization (including imaging, microbiology, ancillary and laboratory) are listed below for reference.     Microbiology: Recent Results (from the past 240 hour(s))  Blood culture (routine x 2)     Status: None (Preliminary result)   Collection Time: 03/01/18 11:40 PM  Result Value Ref Range Status   Specimen Description   Final    BLOOD LEFT FOREARM Performed at [facility], [address], [place], [place] [address]    Special Requests   Final    BOTTLES DRAWN AEROBIC AND ANAEROBIC Blood Culture adequate volume Performed at [facility] [address], [place], [place] [address]    Culture   Final    NO GROWTH 1 DAY Performed at [facility], [place] [address], [place], [place] [address]    Report Status PENDING  Incomplete  Blood culture (routine x 2)     Status: None (Preliminary result)   Collection Time: 03/01/18 11:57 PM  Result Value Ref Range Status   Specimen Description   Final    BLOOD LEFT HAND Performed at [facility] [address] [place], [place]  [address]    Special Requests   Final    BOTTLES DRAWN AEROBIC AND ANAEROBIC Blood Culture adequate volume Performed at [facility] [address] [place], [place] [address]    Culture   Final    NO GROWTH 1 DAY Performed at [facility], [place] [address] [place], [place] [address]    Report Status PENDING  Incomplete  Urine culture     Status: None   Collection Time: 03/02/18  4:00 AM  Result Value Ref Range Status   Specimen Description   Final    URINE, RANDOM Performed at [facility] [address] [place], [place] [address]    Special Requests   Final    NONE Performed at [facility], [place] [address] [place], [place] [address]    Culture   Final    NO GROWTH Performed at [facility], [place] [address], [place], [place] [address]    Report Status 03/03/2018 FINAL  Final     Labs: BNP (last 3 results) Recent Labs    03/01/18 2357  BNP 46.9   Basic Metabolic Panel: Recent Labs  Lab 03/01/18 2357 03/03/18 0813  NA 140  --   K 4.1  --   CL 109  --   CO2 24  --   GLUCOSE 144*  --   BUN 12  --   CREATININE 1.11 1.38*  CALCIUM 8.6*  --    Liver Function Tests: Recent Labs  Lab 03/01/18 2357  AST 22  ALT 19  ALKPHOS 81  BILITOT 0.9  PROT 7.5  ALBUMIN 3.8   No results for input(s): LIPASE, AMYLASE in the last 168 hours. No results for input(s): AMMONIA in the last 168 hours. CBC: Recent Labs  Lab 03/01/18 2357  WBC 8.9  NEUTROABS 5.5  HGB 12.6*  HCT 42.2  MCV 89.4  PLT 266   Cardiac Enzymes: No results for input(s): CKTOTAL, CKMB, CKMBINDEX, TROPONINI in the last 168 hours. BNP: Invalid input(s): POCBNP CBG: Recent Labs  Lab 03/02/18 0729 03/02/18 1203 03/02/18 1701 03/02/18 2148 03/03/18 0747  GLUCAP 129* 169* 140* 151* 134*   D-Dimer No results for input(s): DDIMER in the last 72 hours. Hgb A1c Recent Labs    03/02/18 0805  HGBA1C 6.9*   Lipid Profile No  results for input(s): CHOL, HDL, LDLCALC, TRIG, CHOLHDL, LDLDIRECT in the last 72 hours. Thyroid function studies Recent Labs    03/02/18 0805  TSH 9.229*   Anemia work up No results for input(s): VITAMINB12, FOLATE, FERRITIN, TIBC, IRON, RETICCTPCT in the last 72 hours. Urinalysis    Component Value Date/Time   COLORURINE YELLOW 03/02/2018 0401   APPEARANCEUR CLEAR 03/02/2018 0401   LABSPEC 1.026 03/02/2018 0401   PHURINE 6.0 03/02/2018 0401   GLUCOSEU >=500 (A) 03/02/2018 0401   HGBUR NEGATIVE 03/02/2018 0401   BILIRUBINUR NEGATIVE 03/02/2018 0401   KETONESUR NEGATIVE 03/02/2018 0401   PROTEINUR NEGATIVE 03/02/2018 0401   NITRITE NEGATIVE 03/02/2018 0401   LEUKOCYTESUR NEGATIVE 03/02/2018 0401   Sepsis Labs Invalid input(s): PROCALCITONIN,  WBC,  LACTICIDVEN Microbiology Recent Results (from the past 240 hour(s))  Blood culture (routine x 2)     Status: None (Preliminary result)   Collection Time: 03/01/18 11:40 PM  Result Value Ref Range Status   Specimen Description   Final    BLOOD LEFT FOREARM Performed at [facility], [place] [address], [place], [place] [address]    Special Requests   Final    BOTTLES DRAWN AEROBIC AND ANAEROBIC Blood Culture adequate volume Performed at [facility] [address], [place], [place] [address]    Culture   Final    NO GROWTH 1 DAY Performed at [facility], [place] [address], [place], [place] [address]    Report Status PENDING  Incomplete  Blood culture (routine x 2)     Status: None (Preliminary result)   Collection Time: 03/01/18 11:57 PM  Result Value Ref Range Status   Specimen Description   Final    BLOOD LEFT HAND Performed at [facility] [address] [place], [place] [address]    Special Requests   Final    BOTTLES DRAWN AEROBIC AND ANAEROBIC Blood Culture adequate volume Performed at [facility] [address] [place], [place] [address]     Culture   Final    NO GROWTH 1 DAY Performed at [facility], [place] [address] [place], [place] [address]    Report Status PENDING  Incomplete  Urine culture  Status: None   Collection Time: 03/02/18  4:00 AM  Result Value Ref Range Status   Specimen Description   Final    URINE, RANDOM Performed at [facility] [address], [place], [place] [address]    Special Requests   Final    NONE Performed at [facility], [place] [address] [place], [place] [address]    Culture   Final    NO GROWTH Performed at [facility], [place] [address] [place], [place] [address]    Report Status 03/03/2018 FINAL  Final     Time coordinating discharge: Less than 30 minutes  SIGNED:   Louellen Molder, MD  [facility] 03/03/2018, 11:42 AM Pager   If 7PM-7AM, please contact night-coverage [email or web address] Password [email or web address]

## 2018-03-03 NOTE — Discharge Instructions (Signed)

## 2018-03-03 NOTE — Progress Notes (Signed)
Pharmacy Antibiotic Note  Antonio Andrade is a 67 y.o. male admitted on 03/01/2018 with cellulitis.  Pharmacy has been consulted for vancomycin dosing.  Today, 03/03/18  Day 2 Vanc  Scr rising - 1.11 up to 1.38 after 1 dose of vanc  Afebrile  Plan:  Spoke with Md, plan is to give her a 1 time reduced dose of vanc and then discharge her on PO abxs.   Change vanc from original dosing of 1750mg  IV q24 and give 1 time dose of 1g today at noon then plan for discharge as above   Height: 5' 10.75" (179.7 cm) Weight: 291 lb (132 kg) IBW/kg (Calculated) : 74.73  Temp (24hrs), Avg:98.1 F (36.7 C), Min:97.7 F (36.5 C), Max:98.3 F (36.8 C)  Recent Labs  Lab 03/01/18 2357 03/02/18 0200 03/03/18 0813  WBC 8.9  --   --   CREATININE 1.11  --  1.38*  LATICACIDVEN  --  1.47  --     Estimated Creatinine Clearance: 69.7 mL/min (A) (by C-G formula based on SCr of 1.38 mg/dL (H)).    Allergies  Allergen Reactions  . Amoxicillin Nausea Only    Has patient had a PCN reaction causing immediate rash, facial/tongue/throat swelling, SOB or lightheadedness with hypotension:No Has patient had a PCN reaction causing severe rash involving mucus membranes or skin necrosis: No Has patient had a PCN reaction that required hospitalization: No Has patient had a PCN reaction occurring within the last 10 years: No If all of the above answers are "NO", then may proceed with Cephalosporin use.   Marland Kitchen Penicillin G Nausea And Vomiting    Has patient had a PCN reaction causing immediate rash, facial/tongue/throat swelling, SOB or lightheadedness with hypotension: UNKNOWN Has patient had a PCN reaction causing severe rash involving mucus membranes or skin necrosis: Unknown Has patient had a PCN reaction that required hospitalization: Unknown Has patient had a PCN reaction occurring within the last 10 years: Unknown If all of the above answers are "NO", then may proceed with Cephalosporin use.   . Sulfa  Antibiotics Itching  . Sulfamethoxazole Itching    Thank you for allowing pharmacy to be a part of this patient's care.  Kara Mead 03/03/2018 9:35 AM

## 2018-03-06 ENCOUNTER — Other Ambulatory Visit: Payer: Self-pay

## 2018-03-06 ENCOUNTER — Encounter (HOSPITAL_COMMUNITY): Payer: Self-pay

## 2018-03-06 ENCOUNTER — Inpatient Hospital Stay (HOSPITAL_COMMUNITY)
Admission: EM | Admit: 2018-03-06 | Discharge: 2018-03-10 | DRG: 603 | Disposition: A | Discharge status: Home / Self Care | Payer: Medicare Other | Attending: Internal Medicine | Admitting: Internal Medicine

## 2018-03-06 DIAGNOSIS — E785 Hyperlipidemia, unspecified: Secondary | ICD-10-CM | POA: Diagnosis present

## 2018-03-06 DIAGNOSIS — I5032 Chronic diastolic (congestive) heart failure: Secondary | ICD-10-CM | POA: Diagnosis present

## 2018-03-06 DIAGNOSIS — I110 Hypertensive heart disease with heart failure: Secondary | ICD-10-CM | POA: Diagnosis present

## 2018-03-06 DIAGNOSIS — E669 Obesity, unspecified: Secondary | ICD-10-CM

## 2018-03-06 DIAGNOSIS — J45909 Unspecified asthma, uncomplicated: Secondary | ICD-10-CM | POA: Diagnosis present

## 2018-03-06 DIAGNOSIS — L03119 Cellulitis of unspecified part of limb: Secondary | ICD-10-CM | POA: Diagnosis not present

## 2018-03-06 DIAGNOSIS — Z79890 Hormone replacement therapy (postmenopausal): Secondary | ICD-10-CM

## 2018-03-06 DIAGNOSIS — L03116 Cellulitis of left lower limb: Principal | ICD-10-CM | POA: Diagnosis present

## 2018-03-06 DIAGNOSIS — Z6839 Body mass index (BMI) 39.0-39.9, adult: Secondary | ICD-10-CM

## 2018-03-06 DIAGNOSIS — L03115 Cellulitis of right lower limb: Secondary | ICD-10-CM | POA: Diagnosis present

## 2018-03-06 DIAGNOSIS — Z833 Family history of diabetes mellitus: Secondary | ICD-10-CM

## 2018-03-06 DIAGNOSIS — E119 Type 2 diabetes mellitus without complications: Secondary | ICD-10-CM

## 2018-03-06 DIAGNOSIS — I10 Essential (primary) hypertension: Secondary | ICD-10-CM | POA: Diagnosis present

## 2018-03-06 DIAGNOSIS — Z8249 Family history of ischemic heart disease and other diseases of the circulatory system: Secondary | ICD-10-CM

## 2018-03-06 DIAGNOSIS — Z7984 Long term (current) use of oral hypoglycemic drugs: Secondary | ICD-10-CM

## 2018-03-06 DIAGNOSIS — L0390 Cellulitis, unspecified: Secondary | ICD-10-CM | POA: Diagnosis present

## 2018-03-06 DIAGNOSIS — E039 Hypothyroidism, unspecified: Secondary | ICD-10-CM | POA: Diagnosis present

## 2018-03-06 DIAGNOSIS — E1140 Type 2 diabetes mellitus with diabetic neuropathy, unspecified: Secondary | ICD-10-CM | POA: Diagnosis present

## 2018-03-06 DIAGNOSIS — I878 Other specified disorders of veins: Secondary | ICD-10-CM | POA: Diagnosis present

## 2018-03-06 DIAGNOSIS — K219 Gastro-esophageal reflux disease without esophagitis: Secondary | ICD-10-CM | POA: Diagnosis present

## 2018-03-06 LAB — BASIC METABOLIC PANEL
Anion gap: 6 (ref 5–15)
BUN: 16 mg/dL (ref 8–23)
CHLORIDE: 110 mmol/L (ref 98–111)
CO2: 26 mmol/L (ref 22–32)
Calcium: 8.4 mg/dL — ABNORMAL LOW (ref 8.9–10.3)
Creatinine, Ser: 1.14 mg/dL (ref 0.61–1.24)
GFR calc Af Amer: >60 mL/min (ref >60)
GFR calc non Af Amer: >60 mL/min (ref >60)
GLUCOSE: 173 mg/dL — AB (ref 70–99)
POTASSIUM: 4.3 mmol/L (ref 3.5–5.1)
Sodium: 142 mmol/L (ref 135–145)

## 2018-03-06 LAB — CBC WITH DIFFERENTIAL/PLATELET
ABS IMMATURE GRANULOCYTES: 0.02 10*3/uL (ref 0.00–0.07)
Basophils Absolute: 0.1 10*3/uL (ref 0.0–0.1)
Basophils Relative: 1 %
Eosinophils Absolute: 0.5 10*3/uL (ref 0.0–0.5)
Eosinophils Relative: 7 %
HEMATOCRIT: 39.2 % (ref 39.0–52.0)
Hemoglobin: 11.7 g/dL — ABNORMAL LOW (ref 13.0–17.0)
IMMATURE GRANULOCYTES: 0 %
LYMPHS ABS: 2 10*3/uL (ref 0.7–4.0)
Lymphocytes Relative: 27 %
MCH: 26.7 pg (ref 26.0–34.0)
MCHC: 29.8 g/dL — ABNORMAL LOW (ref 30.0–36.0)
MCV: 89.5 fL (ref 80.0–100.0)
MONOS PCT: 8 %
Monocytes Absolute: 0.6 10*3/uL (ref 0.1–1.0)
NEUTROS ABS: 4.3 10*3/uL (ref 1.7–7.7)
NEUTROS PCT: 57 %
Platelets: 257 10*3/uL (ref 150–400)
RBC: 4.38 MIL/uL (ref 4.22–5.81)
RDW: 16.7 % — ABNORMAL HIGH (ref 11.5–15.5)
WBC: 7.4 10*3/uL (ref 4.0–10.5)
nRBC: 0 % (ref 0.0–0.2)

## 2018-03-06 LAB — I-STAT CG4 LACTIC ACID, ED: Lactic Acid, Venous: 0.73 mmol/L (ref 0.5–1.9)

## 2018-03-06 MED ORDER — VANCOMYCIN HCL 10 G IV SOLR
2500.0000 mg | Freq: Once | INTRAVENOUS | Status: AC
  Administered 2018-03-06: 2500 mg via INTRAVENOUS
  Filled 2018-03-06: qty 500

## 2018-03-06 NOTE — ED Provider Notes (Signed)
[facility] DEPT Provider Note   CSN: [id number] Arrival date & time: 03/06/18  1829     History   Chief Complaint Chief Complaint  Patient presents with  . Leg Pain    HPI Antonio Andrade is a 67 y.o. male.  The history is provided by the patient and medical records.  Leg Pain       67 y.o. M with hx of asthma, DM2, HTN, chronic CHF, neuropathy, presenting to the ED for cellulitis of both legs.  Patient was admitted for same from 11/13 -11/15.  States he was discharged home on doxycycline and Saturday (day after discharge) he started noticing the redness returning.  States it started at the ankles and has since been spreading upward to the knee.  States redness has become more intense in color and has had increased pain and "pressure" sensation in the legs, especially with walking.  States swelling has not remained somewhat well controlled.  He has not had any drainage or weeping from the legs.  He denies any fever or chills.  He has been compliant with his doxycycline.  Past Medical History:  Diagnosis Date  . Asthma   . Diabetes mellitus without complication ([place])   . Hypertension     Patient Active Problem List   Diagnosis Date Noted  . Type 2 diabetes mellitus, controlled ([place]) 03/03/2018  . Chronic diastolic CHF (congestive heart failure) ([place]) 03/03/2018  . Cellulitis of left lower leg 03/03/2018  . Weight gain 03/02/2018  . Type 2 diabetes mellitus ([place]) 03/02/2018  . Diabetic neuropathy ([place]) 03/02/2018  . Hypothyroidism 03/02/2018  . Chronic anemia 03/02/2018  . HTN (hypertension) 03/02/2018  . Depression 03/02/2018  . Mood disorder ([place]) 03/02/2018  . HLD (hyperlipidemia) 03/02/2018  . GERD (gastroesophageal reflux disease) 03/02/2018  . Asthma 03/02/2018    Past Surgical History:  Procedure Laterality Date  . CHOLECYSTECTOMY          Home Medications    Prior to Admission medications   Medication Sig Start Date End  Date Taking? Authorizing Provider  ARIPiprazole (ABILIFY) 2 MG tablet Take 2 mg by mouth daily. 02/08/18  Yes [provider]  atorvastatin (LIPITOR) 40 MG tablet Take 40 mg by mouth daily. 12/23/17  Yes [provider]  cephALEXin (KEFLEX) 500 MG capsule Take 500 mg by mouth 4 (four) times daily.  03/03/18  Yes [provider]  Clobetasol Propionate (TEMOVATE) 0.05 % external spray Apply 1 application topically daily as needed for rash.   Yes [provider]  gabapentin (NEURONTIN) 300 MG capsule Take 300 mg by mouth 2 (two) times daily. 02/08/18  Yes [provider]  Homeopathic Products ([place]) TABS Take 1 tablet by mouth daily.   Yes [provider]  INVOKANA 300 MG TABS tablet Take 300 mg by mouth daily. 12/23/17  Yes [provider]  JANUVIA 100 MG tablet Take 100 mg by mouth daily. 12/26/17  Yes [provider]  levothyroxine (SYNTHROID, LEVOTHROID) 100 MCG tablet Take 1 tablet (100 mcg total) by mouth daily. 03/03/18  Yes Dhungel, Nishant, MD  metFORMIN (GLUCOPHAGE) 1000 MG tablet Take 1,000 mg by mouth 2 (two) times daily. 12/23/17  Yes [provider]  metoprolol tartrate (LOPRESSOR) 100 MG tablet Take 100 mg by mouth 2 (two) times daily.   Yes [provider]  Multiple Vitamins-Minerals (OCUVITE EYE + MULTI) TABS Take 1 tablet by mouth daily.   Yes [provider]  omeprazole ([place])  20 MG capsule Take 20 mg by mouth daily. 02/18/18  Yes [provider]  perphenazine (TRILAFON) 2 MG tablet Take 2 mg by mouth at bedtime.   Yes [provider]  PROAIR HFA 108 (90 Base) MCG/ACT inhaler Inhale 2-4 puffs into the lungs. Every 4 to 6 hours as needed for wheezing or shortness of breath 02/27/18  Yes [provider]  sertraline (ZOLOFT) 100 MG tablet Take 100 mg by mouth daily. 12/23/17  Yes [provider]  telmisartan (MICARDIS) 80 MG tablet Take 80 mg  by mouth daily. 02/26/18  Yes [provider]  topiramate (TOPAMAX) 100 MG tablet Take 100 mg by mouth at bedtime. 12/26/17  Yes [provider]  traZODone (DESYREL) 100 MG tablet Take 100 mg by mouth at bedtime. 02/08/18  Yes [provider]  doxycycline (VIBRA-TABS) 100 MG tablet Take 1 tablet (100 mg total) by mouth 2 (two) times daily for 8 days. Patient not taking: Reported on 03/06/2018 03/03/18 03/11/18  Dhungel, Flonnie Overman, MD  metoprolol (TOPROL-XL) 200 MG 24 hr tablet Take 200 mg by mouth daily.    [provider]  saccharomyces boulardii (FLORASTOR) 250 MG capsule Take 1 capsule (250 mg total) by mouth 2 (two) times daily. 03/03/18   Dhungel, Flonnie Overman, MD  triamcinolone cream (KENALOG) 0.1 % Apply 1 application topically.  03/03/18   [provider]    Family History No family history on file.  Social History Social History   Tobacco Use  . Smoking status: Never Smoker  . Smokeless tobacco: Never Used  Substance Use Topics  . Alcohol use: No    Frequency: Never  . Drug use: No     Allergies   Amoxicillin; Penicillin g; Sulfa antibiotics; and Sulfamethoxazole   Review of Systems Review of Systems  Skin: Positive for color change.  All other systems reviewed and are negative.    Physical Exam Updated Vital Signs BP (!) 146/76   Pulse 61   Temp 97.9 F (36.6 C) (Oral)   Resp 16   Ht 5\' 11"  (1.803 m)   Wt 127 kg   SpO2 99%   BMI 39.05 kg/m   Physical Exam  Constitutional: He is oriented to person, place, and time. He appears well-developed and well-nourished.  HENT:  Head: Normocephalic and atraumatic.  Mouth/Throat: Oropharynx is clear and moist.  Eyes: Pupils are equal, round, and reactive to light. Conjunctivae and EOM are normal.  Neck: Normal range of motion.  Cardiovascular: Normal rate, regular rhythm and normal heart sounds.  Pulmonary/Chest: Effort normal and breath sounds normal.  Abdominal: Soft. Bowel  sounds are normal.  Musculoskeletal: Normal range of motion.  Cellulitis of both lower extremities (see picture below); cellulitis is circumferential at both ankles extending proximally at the legs; there is induration and warmth to touch; no palpable cords noted; DP pulses intact bilaterally; no open wounds or sores noted to the feet or legs  Neurological: He is alert and oriented to person, place, and time.  Skin: Skin is warm and dry.  Psychiatric: He has a normal mood and affect.  Nursing note and vitals reviewed.      ED Treatments / Results  Labs (all labs ordered are listed, but only abnormal results are displayed) Labs Reviewed  CBC WITH DIFFERENTIAL/PLATELET - Abnormal; Notable for the following components:      Result Value   Hemoglobin 11.7 (*)    MCHC 29.8 (*)    RDW 16.7 (*)    All  other components within normal limits  BASIC METABOLIC PANEL - Abnormal; Notable for the following components:   Glucose, Bld 173 (*)    Calcium 8.4 (*)    All other components within normal limits  CULTURE, BLOOD (ROUTINE X 2)  CULTURE, BLOOD (ROUTINE X 2)  I-STAT CG4 LACTIC ACID, ED  I-STAT CG4 LACTIC ACID, ED    EKG None  Radiology No results found.  Procedures Procedures (including critical care time)  Medications Ordered in ED Medications  vancomycin (VANCOCIN) 2,500 mg in sodium chloride 0.9 % 500 mL IVPB (2,500 mg Intravenous New Bag/Given 03/06/18 2317)     Initial Impression / Assessment and Plan / ED Course  I have reviewed the triage vital signs and the nursing notes.  Pertinent labs & imaging results that were available during my care of the patient were reviewed by me and considered in my medical decision making (see chart for details).  67 year old male here with bilateral lower extremity pain and redness.  He was tested in a few days ago for cellulitis of both lower legs and was discharged home on doxycycline after short course of IV antibiotics but reports  symptoms return to his day of discharge.  Redness is been worsening since that time.  He is afebrile and nontoxic in appearance.  Does have cellulitis of both lower legs, circumferential at the ankle and extending proximally almost to the knees.  He has no tissue crepitus, open wounds, sores, drainage, or weeping.  Swelling is minimal in both legs are neurovascularly intact. See photos above.  At this point given rapid recurrence, feel he will need repeat admission.  Patient is agreeable.  Screening labs overall reassuring, normal lactate and blood cell count.  He was restarted on vancomycin as it appears he did well with this in the hospital.  Discussed with Dr. Roel Cluck-- she will admit for ongoing care.  Final Clinical Impressions(s) / ED Diagnoses   Final diagnoses:  Cellulitis of lower extremity, unspecified laterality    ED Discharge Orders    None       Larene Pickett, PA-C 03/07/18 Sharl Ma    Dorie Rank, MD 03/07/18 [phone]

## 2018-03-06 NOTE — Progress Notes (Signed)
A consult was received from an ED physician for vancomycin per pharmacy dosing.  The patient's profile has been reviewed for ht/wt/allergies/indication/available labs.    A one time order has been placed for vancomycin 2500 mg.   Further antibiotics/pharmacy consults should be ordered by admitting physician if indicated.                       Thank you, Eudelia Bunch, Pharm.D [phone] 03/06/2018 10:41 PM

## 2018-03-06 NOTE — ED Triage Notes (Signed)
Patient c/o cellulitis to bilateral legs. D/c Friday after getting IV abx for same. Reports redness, swelling, and pain have increased since that time.

## 2018-03-07 ENCOUNTER — Encounter (HOSPITAL_COMMUNITY): Payer: Self-pay | Admitting: Internal Medicine

## 2018-03-07 DIAGNOSIS — Z8249 Family history of ischemic heart disease and other diseases of the circulatory system: Secondary | ICD-10-CM | POA: Diagnosis not present

## 2018-03-07 DIAGNOSIS — I878 Other specified disorders of veins: Secondary | ICD-10-CM | POA: Diagnosis present

## 2018-03-07 DIAGNOSIS — I10 Essential (primary) hypertension: Secondary | ICD-10-CM | POA: Diagnosis not present

## 2018-03-07 DIAGNOSIS — J45909 Unspecified asthma, uncomplicated: Secondary | ICD-10-CM | POA: Diagnosis present

## 2018-03-07 DIAGNOSIS — K219 Gastro-esophageal reflux disease without esophagitis: Secondary | ICD-10-CM | POA: Diagnosis present

## 2018-03-07 DIAGNOSIS — E118 Type 2 diabetes mellitus with unspecified complications: Secondary | ICD-10-CM | POA: Diagnosis not present

## 2018-03-07 DIAGNOSIS — E669 Obesity, unspecified: Secondary | ICD-10-CM | POA: Diagnosis present

## 2018-03-07 DIAGNOSIS — L03116 Cellulitis of left lower limb: Secondary | ICD-10-CM | POA: Diagnosis present

## 2018-03-07 DIAGNOSIS — E785 Hyperlipidemia, unspecified: Secondary | ICD-10-CM | POA: Diagnosis present

## 2018-03-07 DIAGNOSIS — Z79890 Hormone replacement therapy (postmenopausal): Secondary | ICD-10-CM | POA: Diagnosis not present

## 2018-03-07 DIAGNOSIS — Z6839 Body mass index (BMI) 39.0-39.9, adult: Secondary | ICD-10-CM | POA: Diagnosis not present

## 2018-03-07 DIAGNOSIS — I110 Hypertensive heart disease with heart failure: Secondary | ICD-10-CM | POA: Diagnosis present

## 2018-03-07 DIAGNOSIS — L03119 Cellulitis of unspecified part of limb: Secondary | ICD-10-CM | POA: Diagnosis present

## 2018-03-07 DIAGNOSIS — I5032 Chronic diastolic (congestive) heart failure: Secondary | ICD-10-CM | POA: Diagnosis present

## 2018-03-07 DIAGNOSIS — E039 Hypothyroidism, unspecified: Secondary | ICD-10-CM | POA: Diagnosis present

## 2018-03-07 DIAGNOSIS — Z833 Family history of diabetes mellitus: Secondary | ICD-10-CM | POA: Diagnosis not present

## 2018-03-07 DIAGNOSIS — E1149 Type 2 diabetes mellitus with other diabetic neurological complication: Secondary | ICD-10-CM | POA: Diagnosis not present

## 2018-03-07 DIAGNOSIS — L03115 Cellulitis of right lower limb: Secondary | ICD-10-CM | POA: Diagnosis present

## 2018-03-07 DIAGNOSIS — L0390 Cellulitis, unspecified: Secondary | ICD-10-CM | POA: Diagnosis present

## 2018-03-07 DIAGNOSIS — Z7984 Long term (current) use of oral hypoglycemic drugs: Secondary | ICD-10-CM | POA: Diagnosis not present

## 2018-03-07 DIAGNOSIS — E1140 Type 2 diabetes mellitus with diabetic neuropathy, unspecified: Secondary | ICD-10-CM | POA: Diagnosis present

## 2018-03-07 LAB — CULTURE, BLOOD (ROUTINE X 2)
CULTURE: NO GROWTH
Culture: NO GROWTH
SPECIAL REQUESTS: ADEQUATE
Special Requests: ADEQUATE

## 2018-03-07 LAB — CBC
HCT: 37.2 % — ABNORMAL LOW (ref 39.0–52.0)
Hemoglobin: 11.1 g/dL — ABNORMAL LOW (ref 13.0–17.0)
MCH: 26.6 pg (ref 26.0–34.0)
MCHC: 29.8 g/dL — ABNORMAL LOW (ref 30.0–36.0)
MCV: 89.2 fL (ref 80.0–100.0)
NRBC: 0 % (ref 0.0–0.2)
Platelets: 242 10*3/uL (ref 150–400)
RBC: 4.17 MIL/uL — ABNORMAL LOW (ref 4.22–5.81)
RDW: 16.8 % — AB (ref 11.5–15.5)
WBC: 7.6 10*3/uL (ref 4.0–10.5)

## 2018-03-07 LAB — COMPREHENSIVE METABOLIC PANEL
ALK PHOS: 67 U/L (ref 38–126)
ALT: 22 U/L (ref 0–44)
AST: 19 U/L (ref 15–41)
Albumin: 3.3 g/dL — ABNORMAL LOW (ref 3.5–5.0)
Anion gap: 6 (ref 5–15)
BILIRUBIN TOTAL: 0.7 mg/dL (ref 0.3–1.2)
BUN: 13 mg/dL (ref 8–23)
CO2: 22 mmol/L (ref 22–32)
CREATININE: 1.05 mg/dL (ref 0.61–1.24)
Calcium: 8.1 mg/dL — ABNORMAL LOW (ref 8.9–10.3)
Chloride: 113 mmol/L — ABNORMAL HIGH (ref 98–111)
GFR calc non Af Amer: >60 mL/min (ref >60)
GLUCOSE: 164 mg/dL — AB (ref 70–99)
Potassium: 4 mmol/L (ref 3.5–5.1)
SODIUM: 141 mmol/L (ref 135–145)
TOTAL PROTEIN: 6.2 g/dL — AB (ref 6.5–8.1)

## 2018-03-07 LAB — GLUCOSE, CAPILLARY
GLUCOSE-CAPILLARY: 174 mg/dL — AB (ref 70–99)
GLUCOSE-CAPILLARY: 148 mg/dL — AB (ref 70–99)
GLUCOSE-CAPILLARY: 208 mg/dL — AB (ref 70–99)
Glucose-Capillary: 173 mg/dL — ABNORMAL HIGH (ref 70–99)

## 2018-03-07 LAB — CBG MONITORING, ED: Glucose-Capillary: 110 mg/dL — ABNORMAL HIGH (ref 70–99)

## 2018-03-07 LAB — PHOSPHORUS: Phosphorus: 2.7 mg/dL (ref 2.5–4.6)

## 2018-03-07 LAB — MRSA PCR SCREENING: MRSA by PCR: POSITIVE — AB

## 2018-03-07 LAB — MAGNESIUM: Magnesium: 2.1 mg/dL (ref 1.7–2.4)

## 2018-03-07 LAB — HIV ANTIBODY (ROUTINE TESTING W REFLEX): HIV Screen 4th Generation wRfx: NONREACTIVE

## 2018-03-07 LAB — TSH: TSH: 7.694 u[IU]/mL — ABNORMAL HIGH (ref 0.350–4.500)

## 2018-03-07 MED ORDER — TOPIRAMATE 100 MG PO TABS
100.0000 mg | ORAL_TABLET | Freq: Every day | ORAL | Status: DC
  Administered 2018-03-07 – 2018-03-09 (×4): 100 mg via ORAL
  Filled 2018-03-07 (×4): qty 1

## 2018-03-07 MED ORDER — MUPIROCIN 2 % EX OINT
1.0000 "application " | TOPICAL_OINTMENT | Freq: Two times a day (BID) | CUTANEOUS | Status: DC
  Administered 2018-03-07 – 2018-03-10 (×7): 1 via NASAL
  Filled 2018-03-07 (×2): qty 22

## 2018-03-07 MED ORDER — SODIUM CHLORIDE 0.9 % IV SOLN
250.0000 mL | INTRAVENOUS | Status: DC | PRN
  Administered 2018-03-07 – 2018-03-08 (×2): 250 mL via INTRAVENOUS

## 2018-03-07 MED ORDER — GABAPENTIN 300 MG PO CAPS
300.0000 mg | ORAL_CAPSULE | Freq: Two times a day (BID) | ORAL | Status: DC
  Administered 2018-03-07 – 2018-03-10 (×8): 300 mg via ORAL
  Filled 2018-03-07 (×8): qty 1

## 2018-03-07 MED ORDER — IRBESARTAN 300 MG PO TABS
300.0000 mg | ORAL_TABLET | Freq: Every day | ORAL | Status: DC
  Administered 2018-03-07 – 2018-03-10 (×4): 300 mg via ORAL
  Filled 2018-03-07 (×4): qty 1

## 2018-03-07 MED ORDER — LEVOTHYROXINE SODIUM 100 MCG PO TABS
100.0000 ug | ORAL_TABLET | Freq: Every day | ORAL | Status: DC
  Administered 2018-03-07 – 2018-03-10 (×4): 100 ug via ORAL
  Filled 2018-03-07 (×4): qty 1

## 2018-03-07 MED ORDER — METOPROLOL TARTRATE 50 MG PO TABS
100.0000 mg | ORAL_TABLET | Freq: Two times a day (BID) | ORAL | Status: DC
  Administered 2018-03-07 – 2018-03-09 (×2): 100 mg via ORAL
  Filled 2018-03-07: qty 2
  Filled 2018-03-07: qty 4
  Filled 2018-03-07 (×5): qty 2

## 2018-03-07 MED ORDER — CHLORHEXIDINE GLUCONATE CLOTH 2 % EX PADS
6.0000 | MEDICATED_PAD | Freq: Every day | CUTANEOUS | Status: DC
  Administered 2018-03-07 – 2018-03-10 (×4): 6 via TOPICAL

## 2018-03-07 MED ORDER — ALBUTEROL SULFATE (2.5 MG/3ML) 0.083% IN NEBU: 2.5000 mg | INHALATION_SOLUTION | RESPIRATORY_TRACT | Status: DC | PRN

## 2018-03-07 MED ORDER — INSULIN ASPART 100 UNIT/ML ~~LOC~~ SOLN
0.0000 [IU] | Freq: Every day | SUBCUTANEOUS | Status: DC
  Administered 2018-03-07: 2 [IU] via SUBCUTANEOUS

## 2018-03-07 MED ORDER — PANTOPRAZOLE SODIUM 40 MG PO TBEC
40.0000 mg | DELAYED_RELEASE_TABLET | Freq: Every day | ORAL | Status: DC
  Administered 2018-03-07 – 2018-03-10 (×4): 40 mg via ORAL
  Filled 2018-03-07 (×4): qty 1

## 2018-03-07 MED ORDER — ONDANSETRON HCL 4 MG/2ML IJ SOLN: 4.0000 mg | Freq: Four times a day (QID) | INTRAMUSCULAR | Status: DC | PRN

## 2018-03-07 MED ORDER — ENOXAPARIN SODIUM 60 MG/0.6ML ~~LOC~~ SOLN
60.0000 mg | SUBCUTANEOUS | Status: DC
  Administered 2018-03-07 – 2018-03-09 (×3): 60 mg via SUBCUTANEOUS
  Filled 2018-03-07 (×3): qty 0.6

## 2018-03-07 MED ORDER — ATORVASTATIN CALCIUM 40 MG PO TABS
40.0000 mg | ORAL_TABLET | Freq: Every day | ORAL | Status: DC
  Administered 2018-03-07 – 2018-03-10 (×4): 40 mg via ORAL
  Filled 2018-03-07 (×4): qty 1

## 2018-03-07 MED ORDER — ONDANSETRON HCL 4 MG PO TABS: 4.0000 mg | ORAL_TABLET | Freq: Four times a day (QID) | ORAL | Status: DC | PRN

## 2018-03-07 MED ORDER — HYDROCODONE-ACETAMINOPHEN 5-325 MG PO TABS
1.0000 | ORAL_TABLET | ORAL | Status: DC | PRN
  Administered 2018-03-07 – 2018-03-08 (×2): 1 via ORAL
  Filled 2018-03-07 (×2): qty 1

## 2018-03-07 MED ORDER — ACETAMINOPHEN 325 MG PO TABS: 650.0000 mg | ORAL_TABLET | Freq: Four times a day (QID) | ORAL | Status: DC | PRN

## 2018-03-07 MED ORDER — SODIUM CHLORIDE 0.9% FLUSH: 3.0000 mL | INTRAVENOUS | Status: DC | PRN

## 2018-03-07 MED ORDER — SERTRALINE HCL 100 MG PO TABS
100.0000 mg | ORAL_TABLET | Freq: Every day | ORAL | Status: DC
  Administered 2018-03-07 – 2018-03-10 (×4): 100 mg via ORAL
  Filled 2018-03-07 (×4): qty 1

## 2018-03-07 MED ORDER — AMOXICILLIN-POT CLAVULANATE 875-125 MG PO TABS: 1.0000 | ORAL_TABLET | Freq: Two times a day (BID) | ORAL | Status: DC

## 2018-03-07 MED ORDER — SODIUM CHLORIDE 0.9 % IV SOLN
2.0000 g | INTRAVENOUS | Status: DC
  Administered 2018-03-07 – 2018-03-08 (×2): 2 g via INTRAVENOUS
  Filled 2018-03-07 (×2): qty 2
  Filled 2018-03-07: qty 20

## 2018-03-07 MED ORDER — TRAZODONE HCL 100 MG PO TABS
100.0000 mg | ORAL_TABLET | Freq: Every day | ORAL | Status: DC
  Administered 2018-03-07 – 2018-03-09 (×4): 100 mg via ORAL
  Filled 2018-03-07 (×4): qty 1

## 2018-03-07 MED ORDER — PERPHENAZINE 2 MG PO TABS
2.0000 mg | ORAL_TABLET | Freq: Every day | ORAL | Status: DC
  Administered 2018-03-07 – 2018-03-09 (×4): 2 mg via ORAL
  Filled 2018-03-07 (×4): qty 1

## 2018-03-07 MED ORDER — ARIPIPRAZOLE 2 MG PO TABS
2.0000 mg | ORAL_TABLET | Freq: Every day | ORAL | Status: DC
  Administered 2018-03-07 – 2018-03-10 (×4): 2 mg via ORAL
  Filled 2018-03-07 (×5): qty 1

## 2018-03-07 MED ORDER — SODIUM CHLORIDE 0.9% FLUSH
3.0000 mL | Freq: Two times a day (BID) | INTRAVENOUS | Status: DC
  Administered 2018-03-07 – 2018-03-09 (×6): 3 mL via INTRAVENOUS

## 2018-03-07 MED ORDER — ACETAMINOPHEN 650 MG RE SUPP: 650.0000 mg | Freq: Four times a day (QID) | RECTAL | Status: DC | PRN

## 2018-03-07 MED ORDER — INSULIN ASPART 100 UNIT/ML ~~LOC~~ SOLN
0.0000 [IU] | Freq: Three times a day (TID) | SUBCUTANEOUS | Status: DC
  Administered 2018-03-07 (×2): 3 [IU] via SUBCUTANEOUS
  Administered 2018-03-07 – 2018-03-08 (×2): 2 [IU] via SUBCUTANEOUS
  Administered 2018-03-08: 3 [IU] via SUBCUTANEOUS
  Administered 2018-03-09 (×3): 2 [IU] via SUBCUTANEOUS
  Administered 2018-03-10: 3 [IU] via SUBCUTANEOUS

## 2018-03-07 NOTE — Plan of Care (Signed)
Mr. Antonio Andrade is a 67 year old male with medical history significant for hypertension, type 2 diabetes, asthma, diastolic CHF, A. fib no longer on anticoagulation who presented on 11/19 after recently being discharged 11/15.During hospital stay from 11/13-7/50 patient was treated for presumed bilateral lower leg cellulitis initially with empiric vancomycin before being discharged with doxycycline.  Patient came back after expressing increased redness, lower leg swelling with concern for failed outpatient treatment.  In the ED patient was afebrile hemodynamically stable blood cultures were obtained before reinitiation of IV Vanc.  Of note, by looking at recent pharmacy feels patient picked up doxycycline and Keflex on day of discharge.  Entitle Mr. Antonio Andrade he remarks that he followed the [facility] and instructions for his antibiotic regimen very carefully and took it 4 times a day.  This is more consistent with him taking the Keflex and not the doxycycline that was originally prescribed on discharge.  Bilateral lower leg cellulitis, nonpurulent Less concern for any MRSA given no purulence, hemodynamic stability, no current signs of sepsis.  Suspect bilateral lower leg swelling and erythema is more consistent with venous insufficiency, doubt bilateral cellulitis however given patient has had multiple outpatient treatments prior to this admission with Keflex and ciprofloxacin and now unclear if he actually took doxycycline we advised switching to Augmentin for likely strep coverage. -Patient's empiric vancomycin was discontinued for IV ceftriaxone -Monitor the next 24 hours if remains afebrile with no growth on blood cultures will consider switching to oral Augmentin -Supportive care with compressive stockings and elevation of feet  Rest of chronic problems addressed H&P from the same day by Dr. Simonne Come [facility]

## 2018-03-07 NOTE — H&P (Signed)
Antonio Andrade BPZ:[id number] DOB: 05/21/1950 DOA: 03/06/2018     PCP: London Pepper, MD   Outpatient Specialists: NONE    Patient arrived to ER on 03/06/18 at [facility]  Patient coming from: home Lives alone,   Chief Complaint:  Chief Complaint  Patient presents with  . Leg Pain    HPI: Antonio Andrade is a 67 y.o. male with medical history significant of  HTN, DM 2, asthma CHF thyroidism, a.fib no longer on anticoagulation, OSA does not tolerate CPAP    Presented with self-described cellulitis of both legs states that he has been treated as an outpatient with 2 rounds of oral antibiotics that did not seem to help patient was seen on 14 November was started on vancomycin blood cultures obtained. His BNP was checked and was normal chest x-ray showed no evidence of fluid overload felt that his lower extremity edema was secondary to venous stasis was discharged the next day on doxycycline. Dopplers were neg for DVT  He was given 1 dose of IV Lasix but his kidney function worsened so they did not put him on scheduled Lasix recommended to follow-up with PCP  Hospitalization echogram done showed grade 2 diastolic dysfunction During hospitalization his TSH was noted to be 9.2 and he was started on the increased dose of Synthroid 100 He has not had any fevers or chills no nausea vomiting no diarrhea he has been told in the past that he has heart failure and was told that he gained 22 pounds recently but no associated dyspnea.  Patient presented back today again stating that he continued to have redness and swelling of his lower extremities This has increased in color he is noted increased pain and pressure sensation in his legs no drainage or weeping from his legs. HE followed with his PCP and was switched to Cephalexin so never really took Doxycycline  Regarding pertinent Chronic problems: History of atrial fibrillation used to be on Pradaxa but was cardioverted and converted to sinus rhythm since then  his insurance would not cover Pradaxa and he stopped and has not taken it for many years currently in sinus rhythm from   While in ER: In the ER he was given another dose of IV vancomycin  Hospitalist was called for admission for bilateral lower extremity cellulitis failing outpatient treatment The following Work up has been ordered so far:  Orders Placed This Encounter  Procedures  . Blood culture (routine x 2)  . CBC with Differential  . Basic metabolic panel  . Consult to hospitalist  . I-Stat CG4 Lactic Acid, ED   Following Medications were ordered in ER: Medications  vancomycin (VANCOCIN) 2,500 mg in sodium chloride 0.9 % 500 mL IVPB (2,500 mg Intravenous New Bag/Given 03/06/18 2317)    Significant initial  Findings: Abnormal Labs Reviewed  CBC WITH DIFFERENTIAL/PLATELET - Abnormal; Notable for the following components:      Result Value   Hemoglobin 11.7 (*)    MCHC 29.8 (*)    RDW 16.7 (*)    All other components within normal limits  BASIC METABOLIC PANEL - Abnormal; Notable for the following components:   Glucose, Bld 173 (*)    Calcium 8.4 (*)    All other components within normal limits     Lactic Acid, Venous    Component Value Date/Time   LATICACIDVEN 0.73 03/06/2018 2300    Na 142 K 4.3   Cr stable,   Lab Results  Component Value Date   CREATININE  1.14 03/06/2018   CREATININE 1.38 (H) 03/03/2018   CREATININE 1.11 03/01/2018   WBC  7.4  HG/HCT stable,     Component Value Date/Time   HGB 11.7 (L) 03/06/2018 2257   HCT 39.2 03/06/2018 2257    Troponin (Point of Care Test) No results for input(s): TROPIPOC in the last 72 hours.   BNP (last 3 results) Recent Labs    03/01/18 2357  BNP 18.9    ProBNP (last 3 results) No results for input(s): PROBNP in the last 8760 hours.    UA not ordered   ECG:  Not obtained    ED Triage Vitals  Enc Vitals Group     BP 03/06/18 1834 (!) 156/73     Pulse Rate 03/06/18 1834 70     Resp  03/06/18 1834 14     Temp 03/06/18 1834 97.9 F (36.6 C)     Temp Source 03/06/18 1834 Oral     SpO2 03/06/18 1834 90 %     Weight 03/06/18 1834 280 lb (127 kg)     Height 03/06/18 1834 5\' 11"  (1.803 m)     Head Circumference --      Peak Flow --      Pain Score 03/06/18 1835 4     Pain Loc --      Pain Edu? --      Excl. in [place]? --   TMAX(24)@       Latest  Blood pressure (!) 122/91, pulse (!) 57, temperature 97.9 F (36.6 C), temperature source Oral, resp. rate 17, height 5\' 11"  (1.803 m), weight 127 kg, SpO2 100 %.    Hospitalist was called for admission for cellulitis failed outpatient management   Review of Systems:    Pertinent positives include: bilateral  lower extremity pain swelling redness    Constitutional:  No weight loss, night sweats, Fevers, chills, fatigue, weight loss  HEENT:  No headaches, Difficulty swallowing,Tooth/dental problems,Sore throat,  No sneezing, itching, ear ache, nasal congestion, post nasal drip,  Cardio-vascular:  No chest pain, Orthopnea, PND, anasarca, dizziness, palpitations.no Bilateral lower extremity swelling  GI:  No heartburn, indigestion, abdominal pain, nausea, vomiting, diarrhea, change in bowel habits, loss of appetite, melena, blood in stool, hematemesis Resp:  no shortness of breath at rest. No dyspnea on exertion, No excess mucus, no productive cough, No non-productive cough, No coughing up of blood.No change in color of mucus.No wheezing. Skin:  no rash or lesions. No jaundice GU:  no dysuria, change in color of urine, no urgency or frequency. No straining to urinate.  No flank pain.  Musculoskeletal:  No joint pain or no joint swelling. No decreased range of motion. No back pain.  Psych:  No change in mood or affect. No depression or anxiety. No memory loss.  Neuro: no localizing neurological complaints, no tingling, no weakness, no double vision, no gait abnormality, no slurred speech, no confusion  All systems  reviewed and apart from [place] all are negative  Past Medical History:   Past Medical History:  Diagnosis Date  . Asthma   . Diabetes mellitus without complication ([place])   . Hypertension       Past Surgical History:  Procedure Laterality Date  . CHOLECYSTECTOMY      Social History:  Ambulatory   Independently     reports that he has never smoked. He has never used smokeless tobacco. He reports that he does not drink alcohol or use drugs.     Family  History:   Family History  Problem Relation Age of Onset  . CAD Father   . Hypertension Father   . Diabetes Father   . Cancer Neg Hx   . Stroke Neg Hx     Allergies: Allergies  Allergen Reactions  . Amoxicillin Nausea Only    Has patient had a PCN reaction causing immediate rash, facial/tongue/throat swelling, SOB or lightheadedness with hypotension:No Has patient had a PCN reaction causing severe rash involving mucus membranes or skin necrosis: No Has patient had a PCN reaction that required hospitalization: No Has patient had a PCN reaction occurring within the last 10 years: No If all of the above answers are "NO", then may proceed with Cephalosporin use.   Marland Kitchen Penicillin G Nausea And Vomiting    Has patient had a PCN reaction causing immediate rash, facial/tongue/throat swelling, SOB or lightheadedness with hypotension: UNKNOWN Has patient had a PCN reaction causing severe rash involving mucus membranes or skin necrosis: Unknown Has patient had a PCN reaction that required hospitalization: Unknown Has patient had a PCN reaction occurring within the last 10 years: Unknown If all of the above answers are "NO", then may proceed with Cephalosporin use.   . Sulfa Antibiotics Itching  . Sulfamethoxazole Itching     Prior to Admission medications   Medication Sig Start Date End Date Taking? Authorizing Provider  ARIPiprazole (ABILIFY) 2 MG tablet Take 2 mg by mouth daily. 02/08/18  Yes [provider]    atorvastatin (LIPITOR) 40 MG tablet Take 40 mg by mouth daily. 12/23/17  Yes [provider]  cephALEXin (KEFLEX) 500 MG capsule Take 500 mg by mouth 4 (four) times daily.  03/03/18  Yes [provider]  Clobetasol Propionate (TEMOVATE) 0.05 % external spray Apply 1 application topically daily as needed for rash.   Yes [provider]  gabapentin (NEURONTIN) 300 MG capsule Take 300 mg by mouth 2 (two) times daily. 02/08/18  Yes [provider]  Homeopathic Products ([place]) TABS Take 1 tablet by mouth daily.   Yes [provider]  INVOKANA 300 MG TABS tablet Take 300 mg by mouth daily. 12/23/17  Yes [provider]  JANUVIA 100 MG tablet Take 100 mg by mouth daily. 12/26/17  Yes [provider]  levothyroxine (SYNTHROID, LEVOTHROID) 100 MCG tablet Take 1 tablet (100 mcg total) by mouth daily. 03/03/18  Yes Dhungel, Nishant, MD  metFORMIN (GLUCOPHAGE) 1000 MG tablet Take 1,000 mg by mouth 2 (two) times daily. 12/23/17  Yes [provider]  metoprolol tartrate (LOPRESSOR) 100 MG tablet Take 100 mg by mouth 2 (two) times daily.   Yes [provider]  Multiple Vitamins-Minerals (OCUVITE EYE + MULTI) TABS Take 1 tablet by mouth daily.   Yes [provider]  omeprazole (PRILOSEC) 20 MG capsule Take 20 mg by mouth daily. 02/18/18  Yes [provider]  perphenazine (TRILAFON) 2 MG tablet Take 2 mg by mouth at bedtime.   Yes [provider]  PROAIR HFA 108 (90 Base) MCG/ACT inhaler Inhale 2-4 puffs into the lungs. Every 4 to 6 hours as needed for wheezing or shortness of breath 02/27/18  Yes [provider]  sertraline (ZOLOFT) 100 MG tablet Take 100 mg by mouth daily. 12/23/17  Yes [provider]  telmisartan (MICARDIS) 80 MG tablet Take 80 mg by mouth daily. 02/26/18  Yes [provider]  topiramate (TOPAMAX) 100 MG tablet Take 100 mg by mouth at bedtime. 12/26/17  Yes  [provider]  traZODone (DESYREL) 100 MG tablet Take 100 mg by mouth at bedtime. 02/08/18  Yes [provider]  doxycycline (VIBRA-TABS) 100 MG tablet Take 1 tablet (100 mg total) by mouth 2 (two) times daily for 8 days. Patient not taking: Reported on 03/06/2018 03/03/18 03/11/18  Dhungel, Flonnie Overman, MD  metoprolol (TOPROL-XL) 200 MG 24 hr tablet Take 200 mg by mouth daily.    [provider]  saccharomyces boulardii (FLORASTOR) 250 MG capsule Take 1 capsule (250 mg total) by mouth 2 (two) times daily. 03/03/18   Dhungel, Flonnie Overman, MD  triamcinolone cream (KENALOG) 0.1 % Apply 1 application topically.  03/03/18   [provider]   Physical Exam: Blood pressure (!) 122/91, pulse (!) 57, temperature 97.9 F (36.6 C), temperature source Oral, resp. rate 17, height 5\' 11"  (1.803 m), weight 127 kg, SpO2 100 %. 1. General:  in No Acute distress  well  -appearing 2. Psychological: Alert and  Oriented 3. Head/ENT:   Moist Mucous Membranes                          Head Non traumatic, neck supple                           Poor Dentition 4. SKIN: normal   Skin turgor,  Skin clean Dry bilateral lower extremity circumferential redness induration in warm 5. Heart: Regular rate and rhythm no  Murmur, no Rub or gallop 6. Lungs:  Clear to auscultation bilaterally, no wheezes or crackles   7. Abdomen: Soft,  non-tender, Non distended  obese  bowel sounds present 8. Lower extremities: no clubbing, cyanosis, or trace edema 9. Neurologically Grossly intact, moving all 4 extremities equally  10. MSK: Normal range of motion   LABS:     Recent Labs  Lab 03/01/18 2357 03/06/18 2257  WBC 8.9 7.4  NEUTROABS 5.5 4.3  HGB 12.6* 11.7*  HCT 42.2 39.2  MCV 89.4 89.5  PLT 266 010   Basic Metabolic Panel: Recent Labs  Lab 03/01/18 2357 03/03/18 0813 03/06/18 2257  NA 140  --  142  K 4.1  --  4.3  CL 109  --  110  CO2 24  --  26  GLUCOSE 144*  --  173*  BUN 12  --   16  CREATININE 1.11 1.38* 1.14  CALCIUM 8.6*  --  8.4*      Recent Labs  Lab 03/01/18 2357  AST 22  ALT 19  ALKPHOS 81  BILITOT 0.9  PROT 7.5  ALBUMIN 3.8   No results for input(s): LIPASE, AMYLASE in the last 168 hours. No results for input(s): AMMONIA in the last 168 hours.    HbA1C: No results for input(s): HGBA1C in the last 72 hours. CBG: Recent Labs  Lab 03/02/18 1203 03/02/18 1701 03/02/18 2148 03/03/18 0747 03/03/18 1141  GLUCAP 169* 140* 151* 134* 146*      Urine analysis:    Component Value Date/Time   COLORURINE YELLOW 03/02/2018 0401   APPEARANCEUR CLEAR 03/02/2018 0401   LABSPEC 1.026 03/02/2018 0401   PHURINE 6.0 03/02/2018 0401   GLUCOSEU >=500 (A) 03/02/2018 0401   HGBUR NEGATIVE 03/02/2018 0401   BILIRUBINUR NEGATIVE 03/02/2018 0401   KETONESUR NEGATIVE 03/02/2018 0401   PROTEINUR NEGATIVE 03/02/2018 0401   NITRITE NEGATIVE 03/02/2018 0401   LEUKOCYTESUR NEGATIVE 03/02/2018 0401      Cultures:  Component Value Date/Time   SDES  03/02/2018 0400    URINE, RANDOM Performed at [facility] [address], [place], [place] [address]    SPECREQUEST  03/02/2018 0400    NONE Performed at [facility], [place] [address], [place], [place] [address]    CULT  03/02/2018 0400    NO GROWTH Performed at [facility] [address], [place], [place] [address]    REPTSTATUS 03/03/2018 FINAL 03/02/2018 0400     Radiological Exams on Admission: No results found.  Chart has been reviewed    Assessment/Plan  67 y.o. male with medical history significant of  HTN, DM 2, asthma CHF thyroidism, a.fib no longer on anticoagulation     Admitted for bilateral cellulitis?   Present on Admission: . Cellulitis - pt has failed outpatient management continues to have bilateral warmth and redness suspicious for early cellulitis in the setting of venous stasis -spoke about importance of elevation of lower  extremities, avoiding long periods of sitting down, need for weight loss Department patient received 1 dose of vancomycin will check MRSA PCR, according to cellulitis protocol will switch to ceftriaxone . Diabetic neuropathy (HCC) chronic continue home medications . Hypothyroidism -recently Synthroid has been increased to 100 we will continue new dose . HTN (hypertension) stable continue home medications . HLD (hyperlipidemia) stable continue home medications . GERD (gastroesophageal reflux disease) stable continue home medications . Asthma continue as needed inhalers albuterol as needed . Chronic diastolic CHF (congestive heart failure) (HCC) Appears to be euvolemic recent echogram showed diastolic dysfunction patient had rising creatinine from Lasix currently showing no evidence of fluid overload DM 2-  - Order Sensitive  SSI    -  check TSH and HgA1C  - Hold by mouth medications    Other plan as per orders.  DVT prophylaxis:   Lovenox     Code Status:  FULL CODE   as per patient   I had personally discussed CODE STATUS with patient and     Family Communication:   Family not  at  Bedside     Disposition Plan:       To home once workup is complete and patient is stable                     Consults called    Admission status:    inpatient     Expect 2 midnight stay secondary to severity of patient's current illness including bilateral cellulitis failing outpatient management      DM2   CHF      That are currently affecting medical management.  I expect  patient to be hospitalized for 2 midnights requiring inpatient medical care.  Patient is at high risk for adverse outcome (such as loss of life or disability) if not treated.  Indication for inpatient stay as follows:    Need for IV antibiotics,  IV medications       Level of care       medical floor        Toy Baker 03/07/2018, 12:52 AM    [facility]  Pager [phone]   after 2 AM please page  floor coverage PA If 7AM-7PM, please contact the day team taking care of the patient  [email or web address]  Password [email or web address]

## 2018-03-07 NOTE — ED Notes (Signed)
ED TO INPATIENT HANDOFF REPORT  Name/Age/Gender Antonio Andrade 67 y.o. male  Code Status    Code Status Orders  (From admission, onward)         Start     Ordered   03/07/18 0110  Full code  Continuous     03/07/18 0109        Code Status History    Date Active Date Inactive Code Status Order ID Comments User Context   03/02/2018 0538 03/03/2018 1846 Full Code [id number]  Shela Leff, MD ED      Home/SNF/Other Home  Chief Complaint Leg Pain  Level of Care/Admitting Diagnosis ED Disposition    ED Disposition Condition [place]: [facility] [[id number]]  Level of Care: Med-Surg [16]  Diagnosis: Cellulitis [371696]  Admitting Physician: Toy Baker [[id number]]  Attending Physician: Toy Baker [[id number]]  Estimated length of stay: 3 - 4 days  Certification:: I certify this patient will need inpatient services for at least 2 midnights  PT Class (Do Not Modify): Inpatient [101]  PT Acc Code (Do Not Modify): Private [1]       Medical History Past Medical History:  Diagnosis Date  . Asthma   . Diabetes mellitus without complication ([place])   . Hypertension     Allergies Allergies  Allergen Reactions  . Amoxicillin Nausea Only    Has patient had a PCN reaction causing immediate rash, facial/tongue/throat swelling, SOB or lightheadedness with hypotension:No Has patient had a PCN reaction causing severe rash involving mucus membranes or skin necrosis: No Has patient had a PCN reaction that required hospitalization: No Has patient had a PCN reaction occurring within the last 10 years: No If all of the above answers are "NO", then may proceed with Cephalosporin use.   Marland Kitchen Penicillin G Nausea And Vomiting    Has patient had a PCN reaction causing immediate rash, facial/tongue/throat swelling, SOB or lightheadedness with hypotension: UNKNOWN Has patient had a PCN reaction causing severe rash involving mucus membranes or  skin necrosis: Unknown Has patient had a PCN reaction that required hospitalization: Unknown Has patient had a PCN reaction occurring within the last 10 years: Unknown If all of the above answers are "NO", then may proceed with Cephalosporin use.   . Sulfa Antibiotics Itching  . Sulfamethoxazole Itching    IV Location/Drains/Wounds Patient Lines/Drains/Airways Status   Active Line/Drains/Airways    Name:   Placement date:   Placement time:   Site:   Days:   Peripheral IV 03/06/18 Left Antecubital   03/06/18    2250    Antecubital   1          Labs/Imaging Results for orders placed or performed during the hospital encounter of 03/06/18 (from the past 48 hour(s))  CBC with Differential     Status: Abnormal   Collection Time: 03/06/18 10:57 PM  Result Value Ref Range   WBC 7.4 4.0 - 10.5 K/uL   RBC 4.38 4.22 - 5.81 MIL/uL   Hemoglobin 11.7 (L) 13.0 - 17.0 g/dL   HCT 39.2 39.0 - 52.0 %   MCV 89.5 80.0 - 100.0 fL   MCH 26.7 26.0 - 34.0 pg   MCHC 29.8 (L) 30.0 - 36.0 g/dL   RDW 16.7 (H) 11.5 - 15.5 %   Platelets 257 150 - 400 K/uL   nRBC 0.0 0.0 - 0.2 %   Neutrophils Relative % 57 %   Neutro Abs 4.3 1.7 - 7.7 K/uL  Lymphocytes Relative 27 %   Lymphs Abs 2.0 0.7 - 4.0 K/uL   Monocytes Relative 8 %   Monocytes Absolute 0.6 0.1 - 1.0 K/uL   Eosinophils Relative 7 %   Eosinophils Absolute 0.5 0.0 - 0.5 K/uL   Basophils Relative 1 %   Basophils Absolute 0.1 0.0 - 0.1 K/uL   Immature Granulocytes 0 %   Abs Immature Granulocytes 0.02 0.00 - 0.07 K/uL    Comment: Performed at [facility], [place] [address] [place], [place] [address]  Basic metabolic panel     Status: Abnormal   Collection Time: 03/06/18 10:57 PM  Result Value Ref Range   Sodium 142 135 - 145 mmol/L   Potassium 4.3 3.5 - 5.1 mmol/L   Chloride 110 98 - 111 mmol/L   CO2 26 22 - 32 mmol/L   Glucose, Bld 173 (H) 70 - 99 mg/dL   BUN 16 8 - 23 mg/dL   Creatinine, Ser 1.14 0.61 - 1.24 mg/dL    Calcium 8.4 (L) 8.9 - 10.3 mg/dL   GFR calc non Af Amer >60 >60 mL/min   GFR calc Af Amer >60 >60 mL/min    Comment: (NOTE) The eGFR has been calculated using the CKD EPI equation. This calculation has not been validated in all clinical situations. eGFR's persistently <60 mL/min signify possible Chronic Kidney Disease.    Anion gap 6 5 - 15    Comment: Performed at [facility], [place] [address], [place], [place] [address]  I-Stat CG4 Lactic Acid, ED     Status: None   Collection Time: 03/06/18 11:00 PM  Result Value Ref Range   Lactic Acid, Venous 0.73 0.5 - 1.9 mmol/L  CBG monitoring, ED     Status: Abnormal   Collection Time: 03/07/18  1:16 AM  Result Value Ref Range   Glucose-Capillary 110 (H) 70 - 99 mg/dL   No results found. None  Pending Labs Unresulted Labs (From admission, onward)    Start     Ordered   03/07/18 0500  HIV Antibody (routine testing w rflx)  Tomorrow morning,   R     03/07/18 0109   03/07/18 0500  Magnesium  Tomorrow morning,   R    Comments:  Call MD if <1.5    03/07/18 0109   03/07/18 0500  Phosphorus  Tomorrow morning,   R     03/07/18 0109   03/07/18 0500  TSH  Once,   R    Comments:  Cancel if already done within 1 month and notify MD    03/07/18 0109   03/07/18 0500  Comprehensive metabolic panel  Once,   R    Comments:  Cal MD for K<3.5 or >5.0    03/07/18 0109   03/07/18 0500  CBC  Once,   R    Comments:  Call for hg <8.0    03/07/18 0109   03/07/18 0110  MRSA PCR Screening  Once,   R    Question:  Patient immune status  Answer:  Normal   03/07/18 0109   03/07/18 0046  MRSA PCR Screening  Once,   R    Question:  Patient immune status  Answer:  Normal   03/07/18 0045   03/06/18 2235  Blood culture (routine x 2)  BLOOD CULTURE X 2,   STAT     03/06/18 2235          Vitals/Pain Today's Vitals   03/06/18 2347  03/07/18 0100 03/07/18 0110 03/07/18 0215  BP: (!) 122/91 (!) 162/76 (!) 151/78   Pulse: (!) 57 62 (!)  59   Resp: 17  18   Temp:      TempSrc:      SpO2: 100% 99% 100%   Weight:      Height:      PainSc:   5  1     Isolation Precautions No active isolations  Medications Medications  insulin aspart (novoLOG) injection 0-5 Units (0 Units Subcutaneous Not Given 03/07/18 0116)  cefTRIAXone (ROCEPHIN) 2 g in sodium chloride 0.9 % 100 mL IVPB (has no administration in time range)  insulin aspart (novoLOG) injection 0-15 Units (has no administration in time range)  atorvastatin (LIPITOR) tablet 40 mg (has no administration in time range)  metoprolol tartrate (LOPRESSOR) tablet 100 mg (100 mg Oral Given 03/07/18 0125)  irbesartan (AVAPRO) tablet 300 mg (has no administration in time range)  ARIPiprazole (ABILIFY) tablet 2 mg (has no administration in time range)  perphenazine (TRILAFON) tablet 2 mg (has no administration in time range)  sertraline (ZOLOFT) tablet 100 mg (has no administration in time range)  traZODone (DESYREL) tablet 100 mg (100 mg Oral Given 03/07/18 0124)  levothyroxine (SYNTHROID, LEVOTHROID) tablet 100 mcg (has no administration in time range)  pantoprazole (PROTONIX) EC tablet 40 mg (has no administration in time range)  gabapentin (NEURONTIN) capsule 300 mg (300 mg Oral Given 03/07/18 0124)  topiramate (TOPAMAX) tablet 100 mg (100 mg Oral Given 03/07/18 0124)  acetaminophen (TYLENOL) tablet 650 mg (has no administration in time range)    Or  acetaminophen (TYLENOL) suppository 650 mg (has no administration in time range)  HYDROcodone-acetaminophen (NORCO/VICODIN) 5-325 MG per tablet 1-2 tablet (1 tablet Oral Given 03/07/18 0124)  ondansetron (ZOFRAN) tablet 4 mg (has no administration in time range)    Or  ondansetron (ZOFRAN) injection 4 mg (has no administration in time range)  enoxaparin (LOVENOX) injection 30 mg (has no administration in time range)  sodium chloride flush (NS) 0.9 % injection 3 mL (3 mLs Intravenous Given 03/07/18 0126)  sodium chloride flush  (NS) 0.9 % injection 3 mL (has no administration in time range)  0.9 %  sodium chloride infusion (has no administration in time range)  albuterol (PROVENTIL) (2.5 MG/3ML) 0.083% nebulizer solution 2.5 mg (has no administration in time range)  vancomycin (VANCOCIN) 2,500 mg in sodium chloride 0.9 % 500 mL IVPB (0 mg Intravenous Stopped 03/07/18 0215)    Mobility walks

## 2018-03-08 DIAGNOSIS — I10 Essential (primary) hypertension: Secondary | ICD-10-CM

## 2018-03-08 DIAGNOSIS — I5032 Chronic diastolic (congestive) heart failure: Secondary | ICD-10-CM

## 2018-03-08 DIAGNOSIS — E1149 Type 2 diabetes mellitus with other diabetic neurological complication: Secondary | ICD-10-CM

## 2018-03-08 DIAGNOSIS — L03119 Cellulitis of unspecified part of limb: Secondary | ICD-10-CM

## 2018-03-08 DIAGNOSIS — E118 Type 2 diabetes mellitus with unspecified complications: Secondary | ICD-10-CM

## 2018-03-08 LAB — GLUCOSE, CAPILLARY
GLUCOSE-CAPILLARY: 149 mg/dL — AB (ref 70–99)
Glucose-Capillary: 144 mg/dL — ABNORMAL HIGH (ref 70–99)
Glucose-Capillary: 196 mg/dL — ABNORMAL HIGH (ref 70–99)
Glucose-Capillary: 117 mg/dL — ABNORMAL HIGH (ref 70–99)

## 2018-03-08 NOTE — Progress Notes (Signed)
[facility] PROGRESS NOTE    Progress Note  Antonio Andrade  QPR:[id number] DOB: 04/11/1951 DOA: 03/06/2018 PCP: London Pepper, MD     Brief Narrative:   Antonio Andrade is an 67 y.o. male past medical history of diabetes mellitus type 2, chronic diastolic heart failure A. fib no longer on anticoagulation, recently discharged from the hospital on 02/2018 For bilateral lower extremity cellulitis who came back after she noticed worsening of her erythema, she was hemodynamically stable started on IV vancomycin Assessment/Plan:   Recurrent possibly worsening bilateral lower extremity cellulitis in the setting of venous stasis: Keep legs elevated continue IV Rocephin, culture data is negative till date. Has remained afebrile with no leukocytosis, his erythema has not improved legs continue to be warm to touch and painful. Keep leg elevated above heart level.  Hypothyroidism: Continue Synthroid.  Essential hypertension: Continue current medication blood pressure stable.  Hyperlipidemia: Continue home meds no changes made.  GERD: Continue PPI.  Chronic diastolic heart failure:  Exam she appears to be euvolemic continue current dose of Lasix.  Diabetes mellitus type 2: A1c of 6.9 continue sliding scale insulin blood glucose fairly controlled.   RN Pressure Injury Documentation:    Estimated body mass index is 39.45 kg/m as calculated from the following:   Height as of this encounter: 5\' 11"  (1.803 m).   Weight as of this encounter: 128.3 kg. Malnutrition Type:      Malnutrition Characteristics:      Nutrition Interventions:       DVT prophylaxis: lovexno Family Communication:none Disposition Plan/Barrier to D/C: home in 2 days Code Status:     Code Status Orders  (From admission, onward)         Start     Ordered   03/07/18 0110  Full code  Continuous     03/07/18 0109        Code Status History    Date Active Date Inactive Code Status Order ID  Comments User Context   03/02/2018 0538 03/03/2018 1846 Full Code [id number]  Shela Leff, MD ED        IV Access:    Peripheral IV   Procedures and diagnostic studies:   No results found.   Medical Consultants:    None.  Anti-Infectives:   IV Rocephin  Subjective:    Antonio Andrade he relates his pain and swelling are improved.  Objective:    Vitals:   03/07/18 0953 03/07/18 1211 03/07/18 2120 03/08/18 0608  BP:  108/63 139/75 136/87  Pulse: (!) 55 (!) 47 (!) 52 (!) 59  Resp:  16 16 16   Temp:  98.1 F (36.7 C) 97.8 F (36.6 C) 97.6 F (36.4 C)  TempSrc:  Oral Oral Oral  SpO2:  99% 98% 97%  Weight:      Height:        Intake/Output Summary (Last 24 hours) at 03/08/2018 0907 Last data filed at 03/07/2018 1400 Gross per 24 hour  Intake 105.14 ml  Output -  Net 105.14 ml   Filed Weights   03/06/18 1834 03/07/18 0300  Weight: 127 kg 128.3 kg    Exam: General exam: In no acute distress. Respiratory system: Good air movement and clear to auscultation. Cardiovascular system: S1 & S2 heard, RRR. No JVD, murmurs, rubs, gallops or clicks.  Gastrointestinal system: Abdomen is nondistended, soft and nontender.  Central nervous system: Alert and oriented. No focal neurological deficits. Extremities: No pedal edema. Skin: No rashes, lesions or ulcers Psychiatry: Judgement and insight  appear normal. Mood & affect appropriate.    Data Reviewed:    Labs: Basic Metabolic Panel: Recent Labs  Lab 03/01/18 2357 03/03/18 0813 03/06/18 2257 03/07/18 0450  NA 140  --  142 141  K 4.1  --  4.3 4.0  CL 109  --  110 113*  CO2 24  --  26 22  GLUCOSE 144*  --  173* 164*  BUN 12  --  16 13  CREATININE 1.11 1.38* 1.14 1.05  CALCIUM 8.6*  --  8.4* 8.1*  MG  --   --   --  2.1  PHOS  --   --   --  2.7   GFR Estimated Creatinine Clearance: 90.6 mL/min (by C-G formula based on SCr of 1.05 mg/dL). Liver Function Tests: Recent Labs  Lab 03/01/18 2357  03/07/18 0450  AST 22 19  ALT 19 22  ALKPHOS 81 67  BILITOT 0.9 0.7  PROT 7.5 6.2*  ALBUMIN 3.8 3.3*   No results for input(s): LIPASE, AMYLASE in the last 168 hours. No results for input(s): AMMONIA in the last 168 hours. Coagulation profile No results for input(s): INR, PROTIME in the last 168 hours.  CBC: Recent Labs  Lab 03/01/18 2357 03/06/18 2257 03/07/18 0450  WBC 8.9 7.4 7.6  NEUTROABS 5.5 4.3  --   HGB 12.6* 11.7* 11.1*  HCT 42.2 39.2 37.2*  MCV 89.4 89.5 89.2  PLT 266 257 242   Cardiac Enzymes: No results for input(s): CKTOTAL, CKMB, CKMBINDEX, TROPONINI in the last 168 hours. BNP (last 3 results) No results for input(s): PROBNP in the last 8760 hours. CBG: Recent Labs  Lab 03/07/18 0726 03/07/18 1148 03/07/18 1657 03/07/18 2123 03/08/18 0742  GLUCAP 173* 174* 148* 208* 144*   D-Dimer: No results for input(s): DDIMER in the last 72 hours. Hgb A1c: No results for input(s): HGBA1C in the last 72 hours. Lipid Profile: No results for input(s): CHOL, HDL, LDLCALC, TRIG, CHOLHDL, LDLDIRECT in the last 72 hours. Thyroid function studies: Recent Labs    03/07/18 0450  TSH 7.694*   Anemia work up: No results for input(s): VITAMINB12, FOLATE, FERRITIN, TIBC, IRON, RETICCTPCT in the last 72 hours. Sepsis Labs: Recent Labs  Lab 03/01/18 2357 03/02/18 0200 03/06/18 2257 03/06/18 2300 03/07/18 0450  WBC 8.9  --  7.4  --  7.6  LATICACIDVEN  --  1.47  --  0.73  --    Microbiology Recent Results (from the past 240 hour(s))  Blood culture (routine x 2)     Status: None   Collection Time: 03/01/18 11:40 PM  Result Value Ref Range Status   Specimen Description   Final    BLOOD LEFT FOREARM Performed at [facility], [address] [place], [place] [address]    Special Requests   Final    BOTTLES DRAWN AEROBIC AND ANAEROBIC Blood Culture adequate volume Performed at [place] [facility] [address] [place], [place] [address]      Culture   Final    NO GROWTH 5 DAYS Performed at [facility], [place] [address], [place], [place] [address]    Report Status 03/07/2018 FINAL  Final  Blood culture (routine x 2)     Status: None   Collection Time: 03/01/18 11:57 PM  Result Value Ref Range Status   Specimen Description   Final    BLOOD LEFT HAND Performed at [facility] [address], [place], [place] [address]  Special Requests   Final    BOTTLES DRAWN AEROBIC AND ANAEROBIC Blood Culture adequate volume Performed at [place] [address], [place], [place] [address]    Culture   Final    NO GROWTH 5 DAYS Performed at [facility], [place] [address] [place], [place] [address]    Report Status 03/07/2018 FINAL  Final  Urine culture     Status: None   Collection Time: 03/02/18  4:00 AM  Result Value Ref Range Status   Specimen Description   Final    URINE, RANDOM Performed at [facility] [address] [place], [place] [address]    Special Requests   Final    NONE Performed at [facility], [place] [address] [place], [place] [address]    Culture   Final    NO GROWTH Performed at [facility], [place] [address] [place], [place] [address]    Report Status 03/03/2018 FINAL  Final  Blood culture (routine x 2)     Status: None (Preliminary result)   Collection Time: 03/06/18 10:59 PM  Result Value Ref Range Status   Specimen Description   Final    BLOOD RIGHT FOREARM Performed at [facility], [place] [address] [place], [place] [address]    Special Requests   Final    BOTTLES DRAWN AEROBIC AND ANAEROBIC Blood Culture results may not be optimal due to an excessive volume of blood received in culture bottles Performed at [facility] [address], [place], [place] [address]    Culture PENDING  Incomplete   Report Status PENDING  Incomplete  MRSA PCR Screening     Status: Abnormal    Collection Time: 03/07/18  1:10 AM  Result Value Ref Range Status   MRSA by PCR POSITIVE (A) NEGATIVE Final    Comment:        The GeneXpert MRSA Assay (FDA approved for NASAL specimens only), is one component of a comprehensive MRSA colonization surveillance program. It is not intended to diagnose MRSA infection nor to guide or monitor treatment for MRSA infections. RESULT CALLED TO, READ BACK BY AND VERIFIED WITH: Jeraldine Loots RN [id number] 03/07/18 Lezlie Octave Performed at [facility], [place] [address], [place], [place] [address]      Medications:   . ARIPiprazole  2 mg Oral Daily  . atorvastatin  40 mg Oral Daily  . Chlorhexidine Gluconate Cloth  6 each Topical Q0600  . enoxaparin (LOVENOX) injection  60 mg Subcutaneous Q24H  . gabapentin  300 mg Oral BID  . insulin aspart  0-15 Units Subcutaneous TID WC  . insulin aspart  0-5 Units Subcutaneous QHS  . irbesartan  300 mg Oral Daily  . levothyroxine  100 mcg Oral QAC breakfast  . metoprolol tartrate  100 mg Oral BID  . mupirocin ointment  1 application Nasal BID  . pantoprazole  40 mg Oral Daily  . perphenazine  2 mg Oral QHS  . sertraline  100 mg Oral Daily  . sodium chloride flush  3 mL Intravenous Q12H  . topiramate  100 mg Oral QHS  . traZODone  100 mg Oral QHS   Continuous Infusions: . sodium chloride 250 mL (03/08/18 0814)  . cefTRIAXone (ROCEPHIN)  IV 2 g (03/08/18 0815)      LOS: 1 day   Charlynne Cousins  [facility]   *Please refer to [email or web address], password [email or web address] to get  updated schedule on who will round on this patient, as hospitalists switch teams weekly. If 7PM-7AM, please contact night-coverage at [email or web address], password [email or web address] for any overnight needs.  03/08/2018, 9:07 AM

## 2018-03-09 DIAGNOSIS — E669 Obesity, unspecified: Secondary | ICD-10-CM

## 2018-03-09 LAB — GLUCOSE, CAPILLARY
GLUCOSE-CAPILLARY: 133 mg/dL — AB (ref 70–99)
GLUCOSE-CAPILLARY: 140 mg/dL — AB (ref 70–99)
GLUCOSE-CAPILLARY: 111 mg/dL — AB (ref 70–99)
Glucose-Capillary: 139 mg/dL — ABNORMAL HIGH (ref 70–99)

## 2018-03-09 MED ORDER — SULFAMETHOXAZOLE-TRIMETHOPRIM 800-160 MG PO TABS: 1.0000 | ORAL_TABLET | Freq: Two times a day (BID) | ORAL | Status: DC

## 2018-03-09 MED ORDER — DOXYCYCLINE HYCLATE 100 MG PO TABS
100.0000 mg | ORAL_TABLET | Freq: Two times a day (BID) | ORAL | Status: DC
  Filled 2018-03-09: qty 1

## 2018-03-09 MED ORDER — SULFAMETHOXAZOLE-TRIMETHOPRIM 800-160 MG PO TABS
1.0000 | ORAL_TABLET | Freq: Two times a day (BID) | ORAL | Status: DC
  Administered 2018-03-09 – 2018-03-10 (×3): 1 via ORAL
  Filled 2018-03-09 (×3): qty 1

## 2018-03-09 NOTE — Progress Notes (Signed)
Report received from Laural Benes, RN.  Assessment unchanged. Andre Lefort

## 2018-03-09 NOTE — Progress Notes (Signed)
[facility] PROGRESS NOTE    Progress Note  Antonio Andrade  JHE:[id number] DOB: 09-05-1950 DOA: 03/06/2018 PCP: [place] Pepper, MD     Brief Narrative:   Antonio Andrade is an 67 y.o. male past medical history of diabetes mellitus type 2, chronic diastolic heart failure A. fib no longer on anticoagulation, recently discharged from the hospital on 02/2018 For bilateral lower extremity cellulitis who came back after she noticed worsening of her erythema, she was hemodynamically stable started on IV vancomycin Assessment/Plan:   Recurrent possibly worsening bilateral lower extremity cellulitis in the setting of venous stasis: Shoes have remained negative lower extremity appears less swollen not warm to touch and erythema is improving. We will change to oral Bactrim and watch him overnight.  Hypothyroidism: Continue Synthroid.  Essential hypertension: Continue current medication blood pressure stable.  Hyperlipidemia: Continue home meds no changes made.  GERD: Continue PPI.  Chronic diastolic heart failure:  Exam she appears to be euvolemic continue current dose of Lasix.  Diabetes mellitus type 2: A1c of 6.9 continue sliding scale insulin blood glucose fairly controlled.  Obesity Estimated body mass index is 39.45 kg/m as calculated from the following:   Height as of this encounter: 5\' 11"  (1.803 m).   Weight as of this encounter: 128.3 kg.    DVT prophylaxis: lovexno Family Communication:none Disposition Plan/Barrier to D/C: home in 2 days Code Status:     Code Status Orders  (From admission, onward)         Start     Ordered   03/07/18 0110  Full code  Continuous     03/07/18 0109        Code Status History    Date Active Date Inactive Code Status Order ID Comments User Context   03/02/2018 0538 03/03/2018 1846 Full Code [id number]  Shela Leff, MD ED        IV Access:    Peripheral IV   Procedures and diagnostic studies:   No results  found.   Medical Consultants:    None.  Anti-Infectives:   IV Rocephin  Subjective:    Cassandria Andrade he relates his pain and swelling are significantly improved.  Objective:    Vitals:   03/08/18 1357 03/08/18 2213 03/08/18 2218 03/09/18 0559  BP: (!) 171/83 (!) 148/85  (!) 153/87  Pulse: 61 (!) 59 60 (!) 57  Resp: 16 16  18   Temp: 97.8 F (36.6 C) 98.2 F (36.8 C)  97.6 F (36.4 C)  TempSrc: Oral Oral  Oral  SpO2: 99% 100%  99%  Weight:      Height:        Intake/Output Summary (Last 24 hours) at 03/09/2018 0801 Last data filed at 03/08/2018 1408 Gross per 24 hour  Intake 360 ml  Output -  Net 360 ml   Filed Weights   03/06/18 1834 03/07/18 0300  Weight: 127 kg 128.3 kg    Exam: General exam: In no acute distress. Respiratory system: Good air movement and clear to auscultation. Cardiovascular system: S1 & S2 heard, RRR.  Gastrointestinal system: Abdomen is nondistended, soft and nontender.  Central nervous system: Alert and oriented. No focal neurological deficits. Extremities: No pedal edema. Skin: Lower extremity is not a shiny as yesterday, today the wrinkled erythema is receding not warm or tender to touch. Psychiatry: Judgement and insight appear normal. Mood & affect appropriate.    Data Reviewed:    Labs: Basic Metabolic Panel: Recent Labs  Lab 03/03/18 0813 03/06/18 2257 03/07/18  0450  NA  --  142 141  K  --  4.3 4.0  CL  --  110 113*  CO2  --  26 22  GLUCOSE  --  173* 164*  BUN  --  16 13  CREATININE 1.38* 1.14 1.05  CALCIUM  --  8.4* 8.1*  MG  --   --  2.1  PHOS  --   --  2.7   GFR Estimated Creatinine Clearance: 90.6 mL/min (by C-G formula based on SCr of 1.05 mg/dL). Liver Function Tests: Recent Labs  Lab 03/07/18 0450  AST 19  ALT 22  ALKPHOS 67  BILITOT 0.7  PROT 6.2*  ALBUMIN 3.3*   No results for input(s): LIPASE, AMYLASE in the last 168 hours. No results for input(s): AMMONIA in the last 168  hours. Coagulation profile No results for input(s): INR, PROTIME in the last 168 hours.  CBC: Recent Labs  Lab 03/06/18 2257 03/07/18 0450  WBC 7.4 7.6  NEUTROABS 4.3  --   HGB 11.7* 11.1*  HCT 39.2 37.2*  MCV 89.5 89.2  PLT 257 242   Cardiac Enzymes: No results for input(s): CKTOTAL, CKMB, CKMBINDEX, TROPONINI in the last 168 hours. BNP (last 3 results) No results for input(s): PROBNP in the last 8760 hours. CBG: Recent Labs  Lab 03/08/18 0742 03/08/18 1207 03/08/18 1646 03/08/18 2209 03/09/18 0749  GLUCAP 144* 196* 117* 149* 139*   D-Dimer: No results for input(s): DDIMER in the last 72 hours. Hgb A1c: No results for input(s): HGBA1C in the last 72 hours. Lipid Profile: No results for input(s): CHOL, HDL, LDLCALC, TRIG, CHOLHDL, LDLDIRECT in the last 72 hours. Thyroid function studies: Recent Labs    03/07/18 0450  TSH 7.694*   Anemia work up: No results for input(s): VITAMINB12, FOLATE, FERRITIN, TIBC, IRON, RETICCTPCT in the last 72 hours. Sepsis Labs: Recent Labs  Lab 03/06/18 2257 03/06/18 2300 03/07/18 0450  WBC 7.4  --  7.6  LATICACIDVEN  --  0.73  --    Microbiology Recent Results (from the past 240 hour(s))  Blood culture (routine x 2)     Status: None   Collection Time: 03/01/18 11:40 PM  Result Value Ref Range Status   Specimen Description   Final    BLOOD LEFT FOREARM Performed at [facility], [address] [place], [place] [address]    Special Requests   Final    BOTTLES DRAWN AEROBIC AND ANAEROBIC Blood Culture adequate volume Performed at [facility] [address], [place], [place] [address]    Culture   Final    NO GROWTH 5 DAYS Performed at [facility], [place] [address], [place], [place] [address]    Report Status 03/07/2018 FINAL  Final  Blood culture (routine x 2)     Status: None   Collection Time: 03/01/18 11:57 PM  Result Value Ref Range Status   Specimen Description   Final    BLOOD  LEFT HAND Performed at [facility] [address], [place], [place] [address]    Special Requests   Final    BOTTLES DRAWN AEROBIC AND ANAEROBIC Blood Culture adequate volume Performed at [facility] [address], [place], [place] [address]    Culture   Final    NO GROWTH 5 DAYS Performed at [facility], [place] [address] [place], [place] [address]    Report Status 03/07/2018 FINAL  Final  Urine culture  Status: None   Collection Time: 03/02/18  4:00 AM  Result Value Ref Range Status   Specimen Description   Final    URINE, RANDOM Performed at [facility] [address], [place], [place] [address]    Special Requests   Final    NONE Performed at [facility], [place] [address], [place], [place] [address]    Culture   Final    NO GROWTH Performed at [facility], [place] [address], [place], [place] [address]    Report Status 03/03/2018 FINAL  Final  Blood culture (routine x 2)     Status: None (Preliminary result)   Collection Time: 03/06/18 10:57 PM  Result Value Ref Range Status   Specimen Description   Final    BLOOD LEFT ANTECUBITAL Performed at [facility] [address] [place], [place] [address]    Special Requests   Final    BOTTLES DRAWN AEROBIC AND ANAEROBIC Blood Culture results may not be optimal due to an excessive volume of blood received in culture bottles Performed at [facility] [address], [place], [place] [address]    Culture   Final    NO GROWTH 1 DAY Performed at [facility], [place] [address] [place], [place] [address]    Report Status PENDING  Incomplete  Blood culture (routine x 2)     Status: None (Preliminary result)   Collection Time: 03/06/18 10:59 PM  Result Value Ref Range Status   Specimen Description   Final    BLOOD RIGHT FOREARM Performed at [facility], [place] [address] [place],  [place] [address]    Special Requests   Final    BOTTLES DRAWN AEROBIC AND ANAEROBIC Blood Culture results may not be optimal due to an excessive volume of blood received in culture bottles Performed at [facility] [address], [place], [place] [address]    Culture   Final    NO GROWTH 1 DAY Performed at [facility], [place] [address], [place], [place] [address]    Report Status PENDING  Incomplete  MRSA PCR Screening     Status: Abnormal   Collection Time: 03/07/18  1:10 AM  Result Value Ref Range Status   MRSA by PCR POSITIVE (A) NEGATIVE Final    Comment:        The GeneXpert MRSA Assay (FDA approved for NASAL specimens only), is one component of a comprehensive MRSA colonization surveillance program. It is not intended to diagnose MRSA infection nor to guide or monitor treatment for MRSA infections. RESULT CALLED TO, READ BACK BY AND VERIFIED WITH: Jeraldine Loots RN [id number] 03/07/18 Lezlie Octave Performed at [facility], [place] [address] [place],  [address]      Medications:   . ARIPiprazole  2 mg Oral Daily  . atorvastatin  40 mg Oral Daily  . Chlorhexidine Gluconate Cloth  6 each Topical Q0600  . enoxaparin (LOVENOX) injection  60 mg Subcutaneous Q24H  . gabapentin  300 mg Oral BID  . insulin aspart  0-15 Units Subcutaneous TID WC  . insulin aspart  0-5 Units Subcutaneous QHS  . irbesartan  300 mg Oral Daily  . levothyroxine  100 mcg Oral QAC breakfast  . metoprolol tartrate  100 mg Oral BID  . mupirocin ointment  1 application Nasal BID  . pantoprazole  40 mg Oral Daily  . perphenazine  2 mg  Oral QHS  . sertraline  100 mg Oral Daily  . sodium chloride flush  3 mL Intravenous Q12H  . topiramate  100 mg Oral QHS  . traZODone  100 mg Oral QHS   Continuous Infusions: . sodium chloride Stopped (03/08/18 0914)  . cefTRIAXone (ROCEPHIN)  IV Stopped (03/08/18 0914)      LOS: 2 days   Charlynne Cousins  [facility]   *Please refer to [email or web address], password [email or web address] to get updated schedule on who will round on this patient, as hospitalists switch teams weekly. If 7PM-7AM, please contact night-coverage at [email or web address], password [email or web address] for any overnight needs.  03/09/2018, 8:01 AM

## 2018-03-10 LAB — GLUCOSE, CAPILLARY: Glucose-Capillary: 157 mg/dL — ABNORMAL HIGH (ref 70–99)

## 2018-03-10 MED ORDER — DOXYCYCLINE HYCLATE 100 MG PO TABS: 100.0000 mg | ORAL_TABLET | Freq: Two times a day (BID) | ORAL | 0 refills | Status: DC

## 2018-03-10 MED ORDER — SULFAMETHOXAZOLE-TRIMETHOPRIM 800-160 MG PO TABS: 1.0000 | ORAL_TABLET | Freq: Two times a day (BID) | ORAL | 0 refills | Status: DC

## 2018-03-10 NOTE — Discharge Summary (Addendum)
Physician Discharge Summary  Antonio Andrade QPY:[id number] DOB: 31-Oct-1950 DOA: 03/06/2018  PCP: London Pepper, MD  Admit date: 03/06/2018 Discharge date: 03/10/2018  Admitted From: home Disposition:  Home  Recommendations for Outpatient Follow-up:  1. Follow up with PCP in 1-2 weeks   Home Health:No Equipment/Devices:none  Discharge Condition:stable CODE STATUS:full Diet recommendation: Heart Healthy  Brief/Interim Summary: 67 y.o. male past medical history of diabetes mellitus type 2, chronic diastolic heart failure A. fib no longer on anticoagulation, recently discharged from the hospital on 02/2018 For bilateral lower extremity cellulitis who came back after she noticed worsening of her erythema  Discharge Diagnoses:  Active Problems:   Type 2 diabetes mellitus (HCC)   Diabetic neuropathy (HCC)   Hypothyroidism   HTN (hypertension)   HLD (hyperlipidemia)   GERD (gastroesophageal reflux disease)   Asthma   Type 2 diabetes mellitus, controlled (HCC)   Chronic diastolic CHF (congestive heart failure) (HCC)   Cellulitis   Obesity Recurrent worsening bilateral lower extremity nonpurulent cellulitis in the setting of venous stasis: He was admitted to the hospital started on IV vancomycin culture data remain negative. Was change to oral bactrim which he will cont at home for 14 days  Hypothyroidism: Continue Synthroid.  Essential hypertension: Continue current medication.  Next  Hyperlipidemia: Continue current medication.   Discharge Instructions  Discharge Instructions    Diet - low sodium heart healthy   Complete by:  As directed    Increase activity slowly   Complete by:  As directed      Allergies as of 03/10/2018      Reactions   Penicillin G Nausea And Vomiting   Has patient had a PCN reaction causing immediate rash, facial/tongue/throat swelling, SOB or lightheadedness with hypotension: UNKNOWN Has patient had a PCN reaction causing severe rash  involving mucus membranes or skin necrosis: Unknown Has patient had a PCN reaction that required hospitalization: Unknown Has patient had a PCN reaction occurring within the last 10 years: Unknown If all of the above answers are "NO", then may proceed with Cephalosporin use.   Sulfa Antibiotics Itching   Sulfamethoxazole Itching   Amoxicillin Nausea Only   Has patient had a PCN reaction causing immediate rash, facial/tongue/throat swelling, SOB or lightheadedness with hypotension:No Has patient had a PCN reaction causing severe rash involving mucus membranes or skin necrosis: No Has patient had a PCN reaction that required hospitalization: No Has patient had a PCN reaction occurring within the last 10 years: No If all of the above answers are "NO", then may proceed with Cephalosporin use.      Medication List    STOP taking these medications   cephALEXin 500 MG capsule Commonly known as:  KEFLEX   doxycycline 100 MG tablet Commonly known as:  VIBRA-TABS     TAKE these medications   ARIPiprazole 2 MG tablet Commonly known as:  ABILIFY Take 2 mg by mouth daily.   atorvastatin 40 MG tablet Commonly known as:  LIPITOR Take 40 mg by mouth daily.   Clobetasol Propionate 0.05 % external spray Commonly known as:  TEMOVATE Apply 1 application topically daily as needed for rash.   gabapentin 300 MG capsule Commonly known as:  NEURONTIN Take 300 mg by mouth 2 (two) times daily.   INVOKANA 300 MG Tabs tablet Generic drug:  canagliflozin Take 300 mg by mouth daily.   JANUVIA 100 MG tablet Generic drug:  sitaGLIPtin Take 100 mg by mouth daily.   levothyroxine 100 MCG tablet Commonly known as:  SYNTHROID, LEVOTHROID Take 1 tablet (100 mcg total) by mouth daily.   metFORMIN 1000 MG tablet Commonly known as:  GLUCOPHAGE Take 1,000 mg by mouth 2 (two) times daily.   metoprolol 200 MG 24 hr tablet Commonly known as:  TOPROL-XL Take 200 mg by mouth daily.   metoprolol  tartrate 100 MG tablet Commonly known as:  LOPRESSOR Take 100 mg by mouth 2 (two) times daily.   OCUVITE EYE + MULTI Tabs Take 1 tablet by mouth daily.   omeprazole 20 MG capsule Commonly known as:  PRILOSEC Take 20 mg by mouth daily.   perphenazine 2 MG tablet Commonly known as:  TRILAFON Take 2 mg by mouth at bedtime.   PROAIR HFA 108 (90 Base) MCG/ACT inhaler Generic drug:  albuterol Inhale 2-4 puffs into the lungs. Every 4 to 6 hours as needed for wheezing or shortness of breath   saccharomyces boulardii 250 MG capsule Commonly known as:  FLORASTOR Take 1 capsule (250 mg total) by mouth 2 (two) times daily.   sertraline 100 MG tablet Commonly known as:  ZOLOFT Take 100 mg by mouth daily.   sulfamethoxazole-trimethoprim 800-160 MG tablet Commonly known as:  BACTRIM DS,SEPTRA DS Take 1 tablet by mouth every 12 (twelve) hours.   telmisartan 80 MG tablet Commonly known as:  MICARDIS Take 80 mg by mouth daily.   topiramate 100 MG tablet Commonly known as:  TOPAMAX Take 100 mg by mouth at bedtime.   traZODone 100 MG tablet Commonly known as:  DESYREL Take 100 mg by mouth at bedtime.   triamcinolone cream 0.1 % Commonly known as:  KENALOG Apply 1 application topically.   [facility] MENTAL FOCUS Tabs Take 1 tablet by mouth daily.       Allergies  Allergen Reactions  . Penicillin G Nausea And Vomiting    Has patient had a PCN reaction causing immediate rash, facial/tongue/throat swelling, SOB or lightheadedness with hypotension: UNKNOWN Has patient had a PCN reaction causing severe rash involving mucus membranes or skin necrosis: Unknown Has patient had a PCN reaction that required hospitalization: Unknown Has patient had a PCN reaction occurring within the last 10 years: Unknown If all of the above answers are "NO", then may proceed with Cephalosporin use.   . Sulfa Antibiotics Itching  . Sulfamethoxazole Itching  . Amoxicillin Nausea Only    Has patient had  a PCN reaction causing immediate rash, facial/tongue/throat swelling, SOB or lightheadedness with hypotension:No Has patient had a PCN reaction causing severe rash involving mucus membranes or skin necrosis: No Has patient had a PCN reaction that required hospitalization: No Has patient had a PCN reaction occurring within the last 10 years: No If all of the above answers are "NO", then may proceed with Cephalosporin use.     Consultations:  None   Procedures/Studies: Dg Chest 2 View  Result Date: 03/01/2018 CLINICAL DATA:  Cellulitis of both legs status post 2 rounds of oral antibiotics without improvement. EXAM: CHEST - 2 VIEW COMPARISON:  None. FINDINGS: The heart size and mediastinal contours are within normal limits. Both lungs are clear. The visualized skeletal structures are unremarkable. IMPRESSION: No active cardiopulmonary disease. Electronically Signed   By: Ashley Royalty M.D.   On: 03/01/2018 23:00   Vas [place] Lower Extremity Venous (dvt)  Result Date: 03/02/2018  Lower Venous Study Indications: Edema.  Limitations: Body habitus. Performing Technologist: Landry Mellow RDMS, RVT  Examination Guidelines: A complete evaluation includes B-mode imaging, spectral Doppler, color Doppler, and power Doppler as  needed of all accessible portions of each vessel. Bilateral testing is considered an integral part of a complete examination. Limited examinations for reoccurring indications may be performed as noted.  Right Venous Findings: +---------+---------------+---------+-----------+----------+-------------------+          CompressibilityPhasicitySpontaneityPropertiesSummary             +---------+---------------+---------+-----------+----------+-------------------+ CFV      Full           Yes      Yes                  limited                                                                   visualization due                                                         to pannus            +---------+---------------+---------+-----------+----------+-------------------+ SFJ      Full                                                             +---------+---------------+---------+-----------+----------+-------------------+ FV Prox  Full                                                             +---------+---------------+---------+-----------+----------+-------------------+ FV Mid   Full                                                             +---------+---------------+---------+-----------+----------+-------------------+ FV DistalFull                                                             +---------+---------------+---------+-----------+----------+-------------------+ PFV      Full                                                             +---------+---------------+---------+-----------+----------+-------------------+ POP      Full           Yes      Yes                                      +---------+---------------+---------+-----------+----------+-------------------+  PTV      Full                                                             +---------+---------------+---------+-----------+----------+-------------------+ PERO     Full                                                             +---------+---------------+---------+-----------+----------+-------------------+  Left Venous Findings: +---------+---------------+---------+-----------+----------+-------------------+          CompressibilityPhasicitySpontaneityPropertiesSummary             +---------+---------------+---------+-----------+----------+-------------------+ CFV      Full           Yes      Yes                  limited                                                                   visualization due                                                         to pannus            +---------+---------------+---------+-----------+----------+-------------------+ SFJ      Full                                                             +---------+---------------+---------+-----------+----------+-------------------+ FV Prox  Full                                                             +---------+---------------+---------+-----------+----------+-------------------+ FV Mid   Full                                                             +---------+---------------+---------+-----------+----------+-------------------+ FV DistalFull                                                             +---------+---------------+---------+-----------+----------+-------------------+  PFV      Full                                                             +---------+---------------+---------+-----------+----------+-------------------+ POP      Full           Yes      Yes                                      +---------+---------------+---------+-----------+----------+-------------------+ PTV      Full                                                             +---------+---------------+---------+-----------+----------+-------------------+ PERO     Full                                                             +---------+---------------+---------+-----------+----------+-------------------+    Summary: Right: There is no evidence of deep vein thrombosis in the lower extremity. However, portions of this examination were limited- see technologist comments above. No cystic structure found in the popliteal fossa. Left: There is no evidence of deep vein thrombosis in the lower extremity. However, portions of this examination were limited- see technologist comments above. No cystic structure found in the popliteal fossa.  *See table(s) above for measurements and observations. Electronically signed by Monica Martinez MD on 03/02/2018 at 4:32:26 PM.    Final      Subjective: He has great no complaints.  Discharge Exam: Vitals:   03/10/18 0603 03/10/18 0856  BP: (!) 148/82   Pulse: (!) 50 (!) 56  Resp: 20   Temp: 97.7 F (36.5 C)   SpO2: 99%    Vitals:   03/09/18 1320 03/09/18 2132 03/10/18 0603 03/10/18 0856  BP: 138/81 (!) 141/74 (!) 148/82   Pulse: 67 (!) 50 (!) 50 (!) 56  Resp: 18 16 20    Temp: 98 F (36.7 C) 97.9 F (36.6 C) 97.7 F (36.5 C)   TempSrc: Oral Oral Oral   SpO2: 99% 98% 99%   Weight:      Height:        General: Pt is alert, awake, not in acute distress Cardiovascular: RRR, S1/S2 +, no rubs, no gallops Respiratory: CTA bilaterally, no wheezing, no rhonchi Abdominal: Soft, NT, ND, bowel sounds + Extremities: no edema, no cyanosis    The results of significant diagnostics from this hospitalization (including imaging, microbiology, ancillary and laboratory) are listed below for reference.     Microbiology: Recent Results (from the past 240 hour(s))  Blood culture (routine x 2)     Status: None   Collection Time: 03/01/18 11:40 PM  Result Value Ref Range Status   Specimen Description   Final    BLOOD LEFT FOREARM Performed at [facility], [address],  [place] [address]    Special Requests   Final    BOTTLES DRAWN AEROBIC AND ANAEROBIC Blood Culture adequate volume Performed at [place] [address] [place], [place] [address]    Culture   Final    NO GROWTH 5 DAYS Performed at [facility], [place] [address], [place], [place] [address]    Report Status 03/07/2018 FINAL  Final  Blood culture (routine x 2)     Status: None   Collection Time: 03/01/18 11:57 PM  Result Value Ref Range Status   Specimen Description   Final    BLOOD LEFT HAND Performed at [facility] [address], [place], [place] [address]    Special Requests   Final    BOTTLES DRAWN AEROBIC AND ANAEROBIC Blood Culture adequate volume Performed at [facility] [address], [place], [place] [address]    Culture   Final    NO GROWTH 5 DAYS Performed at [facility], [place] [address], [place], [place] [address]    Report Status 03/07/2018 FINAL  Final  Urine culture     Status: None   Collection Time: 03/02/18  4:00 AM  Result Value Ref Range Status   Specimen Description   Final    URINE, RANDOM Performed at [facility] [address] [place], [place] [address]    Special Requests   Final    NONE Performed at [facility], [place] [address], [place], [place] [address]    Culture   Final    NO GROWTH Performed at [facility], [place] [address], [place], [place] [address]    Report Status 03/03/2018 FINAL  Final  Blood culture (routine x 2)     Status: None (Preliminary result)   Collection Time: 03/06/18 10:57 PM  Result Value Ref Range Status   Specimen Description   Final    BLOOD LEFT ANTECUBITAL Performed at [facility] [address], [place], [place] [address]    Special Requests   Final    BOTTLES DRAWN AEROBIC AND ANAEROBIC Blood Culture results may not be optimal due to an excessive volume of blood received in culture bottles Performed at [facility] [address], [place], [place] [address]    Culture   Final    NO GROWTH 2 DAYS Performed at [facility] [address], [place], [place] [address]    Report Status PENDING  Incomplete  Blood culture (routine x 2)     Status: None (Preliminary result)   Collection Time: 03/06/18 10:59 PM  Result Value Ref Range Status   Specimen Description   Final    BLOOD RIGHT FOREARM Performed at [facility], [place] [address] [place], [place] [address]    Special Requests   Final    BOTTLES DRAWN AEROBIC AND ANAEROBIC Blood Culture results may not be optimal due to an excessive volume of blood received in culture bottles Performed at [facility] [address] [place], [place] [address]    Culture   Final    NO GROWTH 2 DAYS Performed at [facility] [address] [place], [place] [address]    Report Status PENDING  Incomplete  MRSA PCR Screening     Status: Abnormal   Collection Time: 03/07/18  1:10 AM  Result Value Ref Range Status   MRSA by PCR POSITIVE (  A) NEGATIVE Final    Comment:        The GeneXpert MRSA Assay (FDA approved for NASAL specimens only), is one component of a comprehensive MRSA colonization surveillance program. It is not intended to diagnose MRSA infection nor to guide or monitor treatment for MRSA infections. RESULT CALLED TO, READ BACK BY AND VERIFIED WITH: Jeraldine Loots RN [id number] 03/07/18 Lezlie Octave Performed at [facility], [place] [address] [place], [place] [address]      Labs: BNP (last 3 results) Recent Labs    03/01/18 2357  BNP 76.1   Basic Metabolic Panel: Recent Labs  Lab 03/06/18 2257 03/07/18 0450  NA 142 141  K 4.3 4.0  CL 110 113*  CO2 26 22  GLUCOSE 173* 164*  BUN 16 13  CREATININE 1.14 1.05  CALCIUM 8.4* 8.1*  MG  --  2.1  PHOS  --  2.7   Liver Function Tests: Recent Labs  Lab 03/07/18 0450  AST 19  ALT 22  ALKPHOS 67  BILITOT 0.7  PROT 6.2*  ALBUMIN 3.3*   No results for input(s): LIPASE, AMYLASE in the last 168 hours. No results for input(s): AMMONIA in the last 168 hours. CBC: Recent Labs  Lab 03/06/18 2257 03/07/18 0450  WBC 7.4 7.6  NEUTROABS 4.3  --   HGB 11.7* 11.1*  HCT 39.2 37.2*  MCV 89.5 89.2  PLT 257 242   Cardiac Enzymes: No results for input(s): CKTOTAL, CKMB, CKMBINDEX, TROPONINI in the last 168 hours. BNP: Invalid input(s): POCBNP CBG: Recent Labs  Lab 03/09/18 0749 03/09/18 1235 03/09/18 1809 03/09/18 2129 03/10/18 0757  GLUCAP 139* 133* 140* 111* 157*   D-Dimer No results for input(s): DDIMER in the last 72 hours. Hgb A1c No results for input(s): HGBA1C in the last 72  hours. Lipid Profile No results for input(s): CHOL, HDL, LDLCALC, TRIG, CHOLHDL, LDLDIRECT in the last 72 hours. Thyroid function studies No results for input(s): TSH, T4TOTAL, T3FREE, THYROIDAB in the last 72 hours.  Invalid input(s): FREET3 Anemia work up No results for input(s): VITAMINB12, FOLATE, FERRITIN, TIBC, IRON, RETICCTPCT in the last 72 hours. Urinalysis    Component Value Date/Time   COLORURINE YELLOW 03/02/2018 0401   APPEARANCEUR CLEAR 03/02/2018 0401   LABSPEC 1.026 03/02/2018 0401   PHURINE 6.0 03/02/2018 0401   GLUCOSEU >=500 (A) 03/02/2018 0401   HGBUR NEGATIVE 03/02/2018 0401   BILIRUBINUR NEGATIVE 03/02/2018 0401   KETONESUR NEGATIVE 03/02/2018 0401   PROTEINUR NEGATIVE 03/02/2018 0401   NITRITE NEGATIVE 03/02/2018 0401   LEUKOCYTESUR NEGATIVE 03/02/2018 0401   Sepsis Labs Invalid input(s): PROCALCITONIN,  WBC,  LACTICIDVEN Microbiology Recent Results (from the past 240 hour(s))  Blood culture (routine x 2)     Status: None   Collection Time: 03/01/18 11:40 PM  Result Value Ref Range Status   Specimen Description   Final    BLOOD LEFT FOREARM Performed at [facility], [place] [address], [place], [place] [address]    Special Requests   Final    BOTTLES DRAWN AEROBIC AND ANAEROBIC Blood Culture adequate volume Performed at [facility] [address], [place], [place] [address]    Culture   Final    NO GROWTH 5 DAYS Performed at [facility], [place] [address], [place], [place] [address]    Report Status 03/07/2018 FINAL  Final  Blood culture (routine x 2)     Status: None   Collection Time: 03/01/18 11:57 PM  Result Value Ref Range Status   Specimen Description   Final    BLOOD LEFT HAND Performed at [facility] [address], [place], [place] [address]    Special Requests   Final    BOTTLES DRAWN AEROBIC AND ANAEROBIC Blood Culture adequate volume Performed at [facility] [address], [place], [place] [address]    Culture   Final    NO GROWTH 5 DAYS Performed at [facility], [place] [address], [place], [place] [address]    Report Status 03/07/2018 FINAL  Final  Urine culture     Status: None   Collection Time: 03/02/18  4:00 AM  Result Value Ref Range Status   Specimen Description   Final    URINE, RANDOM Performed at [place] [address] [place], [place] [address]    Special Requests   Final    NONE Performed at [facility], [place] [address] [place], [place] [address]    Culture   Final    NO GROWTH Performed at [facility], [place] [address] [place], [place] [address]    Report Status 03/03/2018 FINAL  Final  Blood culture (routine x 2)     Status: None (Preliminary result)   Collection Time: 03/06/18 10:57 PM  Result Value Ref Range Status   Specimen Description   Final    BLOOD LEFT ANTECUBITAL Performed at [facility] [address] [place], [place] [address]    Special Requests   Final    BOTTLES DRAWN AEROBIC AND ANAEROBIC Blood Culture results may not be optimal due to an excessive volume of blood received in culture bottles Performed at [facility] [address], [place], [place] [address]    Culture   Final    NO GROWTH 2 DAYS Performed at [facility] [address], [place], [place] [address]    Report Status PENDING  Incomplete  Blood culture (routine x 2)     Status: None (Preliminary result)   Collection Time: 03/06/18 10:59 PM  Result Value Ref Range Status   Specimen Description   Final    BLOOD RIGHT FOREARM Performed at [facility], [place] [address] [place], [place] [address]    Special Requests   Final    BOTTLES DRAWN AEROBIC AND ANAEROBIC Blood Culture results may not be optimal due to an excessive volume of blood received in culture bottles Performed at [place] [facility]  [address], [place], [place] [address]    Culture   Final    NO GROWTH 2 DAYS Performed at [facility] [address] [place], [place] [address]    Report Status PENDING  Incomplete  MRSA PCR Screening     Status: Abnormal   Collection Time: 03/07/18  1:10 AM  Result Value Ref Range Status   MRSA by PCR POSITIVE (A) NEGATIVE Final    Comment:        The GeneXpert MRSA Assay (FDA approved for NASAL specimens only), is one component of a comprehensive MRSA colonization surveillance program. It is not intended to diagnose MRSA infection nor to guide or monitor treatment for MRSA infections. RESULT CALLED TO, READ BACK BY AND VERIFIED WITH: Jeraldine Loots RN [id number] 03/07/18 Lezlie Octave Performed at [facility], [place] [address] [place], [place] [address]      Time coordinating discharge: 35 minutes  SIGNED:   Charlynne Cousins, MD  [facility] 03/10/2018, 9:03 AM Pager   If 7PM-7AM, please contact night-coverage [email or web address] Password [email or web address]

## 2018-03-10 NOTE — Care Management Note (Signed)
Case Management Note  Patient Details  Name: Chesney Klimaszewski MRN: [id number] Date of Birth: 12/09/1950  Subjective/Objective:                  DISCHARGED  Action/Plan: Discharged to home with self-care, orders checked for hhc needs. No CM needs present at time of discharge.  Expected Discharge Date:  03/10/18               Expected Discharge Plan:  Home/Self Care  In-House Referral:     Discharge planning Services  CM Consult  Post Acute Care Choice:    Choice offered to:     DME Arranged:    DME Agency:     HH Arranged:    HH Agency:     Status of Service:  Completed, signed off  If discussed at [employer] of Stay Meetings, dates discussed:    Additional Comments:  Leeroy Cha, RN 03/10/2018, 8:31 AM

## 2018-03-12 LAB — CULTURE, BLOOD (ROUTINE X 2)
Culture: NO GROWTH
Culture: NO GROWTH

## 2018-09-25 ENCOUNTER — Encounter: Payer: Self-pay | Admitting: Pulmonary Disease

## 2018-09-25 ENCOUNTER — Ambulatory Visit: Payer: Medicare Other | Admitting: Pulmonary Disease

## 2018-09-25 ENCOUNTER — Other Ambulatory Visit: Payer: Self-pay

## 2018-09-25 VITALS — BP 118/62 | HR 94 | Ht 70.0 in | Wt 269.8 lb

## 2018-09-25 DIAGNOSIS — Z23 Encounter for immunization: Secondary | ICD-10-CM | POA: Diagnosis not present

## 2018-09-25 DIAGNOSIS — J4550 Severe persistent asthma, uncomplicated: Secondary | ICD-10-CM | POA: Diagnosis not present

## 2018-09-25 LAB — CBC WITH DIFFERENTIAL/PLATELET
Basophils Absolute: 0 10*3/uL (ref 0.0–0.1)
Basophils Relative: 0.5 % (ref 0.0–3.0)
Eosinophils Absolute: 0.5 10*3/uL (ref 0.0–0.7)
Eosinophils Relative: 4.8 % (ref 0.0–5.0)
HCT: 42.3 % (ref 39.0–52.0)
Hemoglobin: 13.4 g/dL (ref 13.0–17.0)
Lymphocytes Relative: 18.3 % (ref 12.0–46.0)
Lymphs Abs: 1.8 10*3/uL (ref 0.7–4.0)
MCHC: 31.7 g/dL (ref 30.0–36.0)
MCV: 80.3 fl (ref 78.0–100.0)
Monocytes Absolute: 0.7 10*3/uL (ref 0.1–1.0)
Monocytes Relative: 6.7 % (ref 3.0–12.0)
Neutro Abs: 6.9 10*3/uL (ref 1.4–7.7)
Neutrophils Relative %: 69.7 % (ref 43.0–77.0)
Platelets: 258 10*3/uL (ref 150.0–400.0)
RBC: 5.27 Mil/uL (ref 4.22–5.81)
RDW: 20 % — ABNORMAL HIGH (ref 11.5–15.5)
WBC: 9.8 10*3/uL (ref 4.0–10.5)

## 2018-09-25 MED ORDER — MONTELUKAST SODIUM 10 MG PO TABS: 10.0000 mg | ORAL_TABLET | Freq: Every day | ORAL | 5 refills | Status: DC

## 2018-09-25 NOTE — Addendum Note (Signed)
Addended by: Elton Sin on: 09/25/2018 11:12 AM   Modules accepted: Orders

## 2018-09-25 NOTE — Addendum Note (Signed)
Addended by: Suzzanne Cloud E on: 09/25/2018 11:03 AM   Modules accepted: Orders

## 2018-09-25 NOTE — Progress Notes (Signed)
Antonio Andrade    [id number]    [birth year]  Primary Care Physician:Morrow, Marjory Lies, MD  Referring Physician: London Pepper, MD [place],  [id number]  Chief complaint: Consult for dyspnea, wheezing  HPI: 68 year old with history of dyspnea, asthma, diabetes, diastolic heart failure, atrial fibrillation Seen in primary care on 5/20 with dyspnea which was treated with a prednisone taper Usually he needs about 2 prednisone tapers a year.  Reports no frequent exacerbations over the past year.  He has required 3 rounds of prednisone since April 2020  He was previously maintained on just albuterol.  Started on Advair 1 month ago Reports since he is not using his albuterol inhaler as frequently. No cough, sputum production.  Denies any GERD symptoms or postnasal drip. He has history of allergies.  Had received allergy injections in the 1980s.  He was told that he has sensitivity to multiple environmental agents including tree, grass pollen.  Pets: No pets Occupation: Retired Radio producer Exposures: No known exposures, no mold, hot tub, Jacuzzi, humidifier Smoking history: Never smoker Travel history: Originally from [place].  Previously lived in [place] Relevant family history: No significant family history of lung disease  Outpatient Encounter Medications as of 09/25/2018  Medication Sig  . ARIPiprazole (ABILIFY) 2 MG tablet Take 2 mg by mouth daily.  Marland Kitchen atorvastatin (LIPITOR) 40 MG tablet Take 40 mg by mouth daily.  . Clobetasol Propionate (TEMOVATE) 0.05 % external spray Apply 1 application topically daily as needed for rash.  . Fluticasone-Salmeterol (ADVAIR DISKUS) 250-50 MCG/DOSE AEPB Inhale 1 puff into the lungs 2 (two) times daily.  Marland Kitchen gabapentin (NEURONTIN) 300 MG capsule Take 300 mg by mouth 2 (two) times daily.  Marland Kitchen levothyroxine (SYNTHROID, LEVOTHROID) 100 MCG tablet Take 1 tablet (100 mcg total) by mouth daily.  . metFORMIN (GLUCOPHAGE) 1000  MG tablet Take 1,000 mg by mouth 2 (two) times daily.  . metoprolol (TOPROL-XL) 200 MG 24 hr tablet Take 200 mg by mouth daily.  . metoprolol tartrate (LOPRESSOR) 100 MG tablet Take 100 mg by mouth 2 (two) times daily.  Marland Kitchen omeprazole (PRILOSEC) 20 MG capsule Take 20 mg by mouth daily.  Marland Kitchen perphenazine (TRILAFON) 2 MG tablet Take 2 mg by mouth at bedtime.  . sertraline (ZOLOFT) 100 MG tablet Take 100 mg by mouth daily.  Marland Kitchen sulfamethoxazole-trimethoprim (BACTRIM DS,SEPTRA DS) 800-160 MG tablet Take 1 tablet by mouth every 12 (twelve) hours.  Marland Kitchen telmisartan (MICARDIS) 80 MG tablet Take 80 mg by mouth daily.  Marland Kitchen topiramate (TOPAMAX) 100 MG tablet Take 100 mg by mouth at bedtime.  . traZODone (DESYREL) 100 MG tablet Take 100 mg by mouth at bedtime.  . triamcinolone cream (KENALOG) 0.1 % Apply 1 application topically.   . Homeopathic Products [facility] MENTAL FOCUS) TABS Take 1 tablet by mouth daily.  . INVOKANA 300 MG TABS tablet Take 300 mg by mouth daily.  Marland Kitchen JANUVIA 100 MG tablet Take 100 mg by mouth daily.  . Multiple Vitamins-Minerals (OCUVITE EYE + MULTI) TABS Take 1 tablet by mouth daily.  Marland Kitchen PROAIR HFA 108 (90 Base) MCG/ACT inhaler Inhale 2-4 puffs into the lungs. Every 4 to 6 hours as needed for wheezing or shortness of breath  . saccharomyces boulardii (FLORASTOR) 250 MG capsule Take 1 capsule (250 mg total) by mouth 2 (two) times daily. (Patient not taking: Reported on 09/25/2018)   No facility-administered encounter medications on file as of 09/25/2018.     Allergies  as of 09/25/2018 - Review Complete 09/25/2018  Allergen Reaction Noted  . Penicillin g Nausea And Vomiting 07/26/2016  . Sulfa antibiotics Itching 07/26/2016  . Sulfamethoxazole Itching 03/26/2013  . Amoxicillin Nausea Only 04/20/2017    Past Medical History:  Diagnosis Date  . Asthma   . Diabetes mellitus without complication ([place])   . Hypertension     Past Surgical History:  Procedure Laterality Date  .  CHOLECYSTECTOMY      Family History  Problem Relation Age of Onset  . CAD Father   . Hypertension Father   . Diabetes Father   . Cancer Neg Hx   . Stroke Neg Hx     Social History   Socioeconomic History  . Marital status: Divorced    Spouse name: Not on file  . Number of children: Not on file  . Years of education: Not on file  . Highest education level: Not on file  Occupational History  . Not on file  Social Needs  . Financial resource strain: Not on file  . Food insecurity:    Worry: Not on file    Inability: Not on file  . Transportation needs:    Medical: Not on file    Non-medical: Not on file  Tobacco Use  . Smoking status: Never Smoker  . Smokeless tobacco: Never Used  Substance and Sexual Activity  . Alcohol use: No    Frequency: Never  . Drug use: No  . Sexual activity: Not Currently  Lifestyle  . Physical activity:    Days per week: Not on file    Minutes per session: Not on file  . Stress: Not on file  Relationships  . Social connections:    Talks on phone: Not on file    Gets together: Not on file    Attends religious service: Not on file    Active member of club or organization: Not on file    Attends meetings of clubs or organizations: Not on file    Relationship status: Not on file  . Intimate partner violence:    Fear of current or ex partner: Not on file    Emotionally abused: Not on file    Physically abused: Not on file    Forced sexual activity: Not on file  Other Topics Concern  . Not on file  Social History Narrative  . Not on file    Review of systems: Review of Systems  Constitutional: Negative for fever and chills.  HENT: Negative.   Eyes: Negative for blurred vision.  Respiratory: as per HPI  Cardiovascular: Negative for chest pain and palpitations.  Gastrointestinal: Negative for vomiting, diarrhea, blood per rectum. Genitourinary: Negative for dysuria, urgency, frequency and hematuria.  Musculoskeletal: Negative for  myalgias, back pain and joint pain.  Skin: Negative for itching and rash.  Neurological: Negative for dizziness, tremors, focal weakness, seizures and loss of consciousness.  Endo/Heme/Allergies: Negative for environmental allergies.  Psychiatric/Behavioral: Negative for depression, suicidal ideas and hallucinations.  All other systems reviewed and are negative.  Physical Exam: Blood pressure 118/62, pulse 94, height 5\' 10"  (1.778 m), weight 269 lb 12.8 oz (122.4 kg), SpO2 96 %. Gen:      No acute distress HEENT:  EOMI, sclera anicteric Neck:     No masses; no thyromegaly Lungs:    Clear to auscultation bilaterally; normal respiratory effort CV:         Regular rate and rhythm; no murmurs Abd:      +  bowel sounds; soft, non-tender; no palpable masses, no distension Ext:    No edema; adequate peripheral perfusion Skin:      Warm and dry; no rash Neuro: alert and oriented x 3 Psych: normal mood and affect  Data Reviewed: Imaging: Chest x-ray 03/01/2018-no active cardiopulmonary disease  Labs: CBC 03/06/2018-WBC 7.4, eos 7%, absolute eosinophil count 518  Assessment:  Severe persistent asthma He has required several rounds of prednisone this year.  Just started 1 month ago on steroid/LABA inhaler which has stabilized his symptoms We will start him on Singulair, check CBC differential, IgE  We will monitor his symptom control going forward with this regimen.  If he continues to have exacerbations then consider biologic therapy.  Health maintenance No record of Pneumovax from primary care.  We will give today  Plan/Recommendations: - Continue Advair, albuterol - Start Singulair - Check CBC differential, IgE, PFTs - Pneumovax  Marshell Garfinkel MD [facility] 09/25/2018, 10:26 AM  CC: London Pepper, MD

## 2018-09-25 NOTE — Addendum Note (Signed)
Addended by: Elton Sin on: 09/25/2018 10:59 AM   Modules accepted: Orders

## 2018-09-25 NOTE — Patient Instructions (Addendum)
Continue the Advair and albuterol inhaler We will start you on a medication called Singulair once daily We will check some labs including CBC differential, IgE We will schedule you for pulmonary function test Pneumovax today  Based on your response to therapy we will decide if we need to escalate Please give [place] a call if there is any worsening of symptoms Follow-up in 3 months.

## 2018-09-26 LAB — IGE: IgE (Immunoglobulin E), Serum: 110 kU/L (ref <=114)

## 2019-01-18 ENCOUNTER — Ambulatory Visit: Payer: Medicare Other | Admitting: Podiatry

## 2019-01-18 ENCOUNTER — Encounter: Payer: Self-pay | Admitting: Podiatry

## 2019-01-18 ENCOUNTER — Ambulatory Visit (INDEPENDENT_AMBULATORY_CARE_PROVIDER_SITE_OTHER): Payer: Medicare Other

## 2019-01-18 ENCOUNTER — Other Ambulatory Visit: Payer: Self-pay

## 2019-01-18 VITALS — BP 126/72 | HR 85 | Resp 16

## 2019-01-18 DIAGNOSIS — E1142 Type 2 diabetes mellitus with diabetic polyneuropathy: Secondary | ICD-10-CM

## 2019-01-20 NOTE — Progress Notes (Signed)
Subjective:  Patient ID: Antonio Andrade, male    DOB: January 08, 1951,  MRN: [id number] HPI Chief Complaint  Patient presents with  . Diabetes    Foot Exam - last a1c was 6.9, neuropathy - numbness in both feet, takes gabapentin '300mg'$  BID  . New Patient (Initial Visit)    68 y.o. male presents with the above complaint.   ROS: Denies fever chills nausea vomiting muscle aches pains calf pain back pain chest pain shortness of breath.  Past Medical History:  Diagnosis Date  . Asthma   . Diabetes mellitus without complication ([place])   . Hypertension    Past Surgical History:  Procedure Laterality Date  . CHOLECYSTECTOMY      Current Outpatient Medications:  .  cetirizine (ZYRTEC) 10 MG tablet, Take 10 mg by mouth daily., Disp: , Rfl:  .  Clobetasol Propionate (CLOBEX) 0.05 % lotion, Apply topically 2 (two) times daily., Disp: , Rfl:  .  fexofenadine (ALLEGRA) 180 MG tablet, Take 180 mg by mouth daily., Disp: , Rfl:  .  fluticasone (FLONASE) 50 MCG/ACT nasal spray, Place into both nostrils daily., Disp: , Rfl:  .  loratadine (CLARITIN) 10 MG tablet, Take 10 mg by mouth daily., Disp: , Rfl:  .  ARIPiprazole (ABILIFY) 2 MG tablet, Take 2 mg by mouth daily., Disp: , Rfl:  .  atorvastatin (LIPITOR) 40 MG tablet, Take 40 mg by mouth daily., Disp: , Rfl:  .  Blood Glucose Monitoring Suppl (ONETOUCH VERIO FLEX SYSTEM) w/Device KIT, , Disp: , Rfl:  .  Fluticasone-Salmeterol (ADVAIR DISKUS) 250-50 MCG/DOSE AEPB, Inhale 1 puff into the lungs 2 (two) times daily., Disp: , Rfl:  .  gabapentin (NEURONTIN) 300 MG capsule, Take 300 mg by mouth 2 (two) times daily., Disp: , Rfl:  .  levothyroxine (SYNTHROID) 88 MCG tablet, 100 mcg. , Disp: , Rfl:  .  metFORMIN (GLUCOPHAGE) 1000 MG tablet, Take 1,000 mg by mouth 2 (two) times daily., Disp: , Rfl:  .  montelukast (SINGULAIR) 10 MG tablet, Take 1 tablet (10 mg total) by mouth at bedtime., Disp: 30 tablet, Rfl: 5 .  Multiple Vitamins-Minerals (OCUVITE EYE +  MULTI) TABS, Take 1 tablet by mouth daily., Disp: , Rfl:  .  omeprazole (PRILOSEC) 20 MG capsule, Take 20 mg by mouth daily., Disp: , Rfl:  .  ONETOUCH VERIO test strip, , Disp: , Rfl:  .  OZEMPIC, 1 MG/DOSE, 2 MG/1.5ML SOPN, , Disp: , Rfl:  .  perphenazine (TRILAFON) 2 MG tablet, Take 2 mg by mouth at bedtime., Disp: , Rfl:  .  PROAIR HFA 108 (90 Base) MCG/ACT inhaler, Inhale 2-4 puffs into the lungs. Every 4 to 6 hours as needed for wheezing or shortness of breath, Disp: , Rfl:  .  sertraline (ZOLOFT) 100 MG tablet, Take 100 mg by mouth daily., Disp: , Rfl:  .  tamsulosin (FLOMAX) 0.4 MG CAPS capsule, , Disp: , Rfl:  .  telmisartan (MICARDIS) 80 MG tablet, Take 80 mg by mouth daily., Disp: , Rfl:  .  temazepam (RESTORIL) 15 MG capsule, , Disp: , Rfl:  .  traZODone (DESYREL) 100 MG tablet, Take 100 mg by mouth at bedtime., Disp: , Rfl:  .  triamcinolone cream (KENALOG) 0.1 %, Apply 1 application topically. , Disp: , Rfl:   Allergies  Allergen Reactions  . Penicillin G Nausea And Vomiting    Has patient had a PCN reaction causing immediate rash, facial/tongue/throat swelling, SOB or lightheadedness with hypotension: UNKNOWN Has patient had  a PCN reaction causing severe rash involving mucus membranes or skin necrosis: Unknown Has patient had a PCN reaction that required hospitalization: Unknown Has patient had a PCN reaction occurring within the last 10 years: Unknown If all of the above answers are "NO", then may proceed with Cephalosporin use.   . Sulfa Antibiotics Itching  . Sulfamethoxazole Itching  . Amoxicillin Nausea Only    Has patient had a PCN reaction causing immediate rash, facial/tongue/throat swelling, SOB or lightheadedness with hypotension:No Has patient had a PCN reaction causing severe rash involving mucus membranes or skin necrosis: No Has patient had a PCN reaction that required hospitalization: No Has patient had a PCN reaction occurring within the last 10 years: No  If all of the above answers are "NO", then may proceed with Cephalosporin use.    Review of Systems Objective:   Vitals:   01/18/19 0837  BP: 126/72  Pulse: 85  Resp: 16    General: Well developed, nourished, in no acute distress, alert and oriented x3   Dermatological: Skin is warm, dry and supple bilateral. Nails x 10 are well maintained; remaining integument appears unremarkable at this time. There are no open sores, no preulcerative lesions, no rash or signs of infection present.  Vascular: Dorsalis Pedis artery and Posterior Tibial artery pedal pulses are 2/4 bilateral with immedate capillary fill time. Pedal hair growth present. No varicosities and no lower extremity edema present bilateral.   Neruologic: Grossly intact via light touch bilateral. Vibratory intact via tuning fork bilateral. Protective threshold with Semmes Wienstein monofilament diminished to forefoot primarily, bilateral. Patellar and Achilles deep tendon reflexes 2+ bilateral. No Babinski or clonus noted bilateral.   Musculoskeletal: No gross boney pedal deformities bilateral. No pain, crepitus, or limitation noted with foot and ankle range of motion bilateral. Muscular strength 5/5 in all groups tested bilateral.  Gait: Unassisted, Nonantalgic.    Radiographs:  Radiographs taken today demonstrate an osseously mature individual splotchy osteopenia around the midfoot.  Partial amputation distal phalanx hallux left.  Some early osteoarthritic changes of the toes a little hallux interphalangeal him with some osteoarthritis and hammertoe deformities bilateral.  Assessment & Plan:   Assessment: Diabetic foot with diabetic peripheral neuropathy mild hammertoe deformities bilateral  Plan: Discussed appropriate shoe gear.  Discussed the numbness associated with diabetic peripheral neuropathy and the fact that the numbness will not change.  Did expressed to him that this could worsen with worsening of his diabetes  and then was very important for him to control his sugar.  Follow-up with him in 6 months for diabetic check.     Jahzeel Poythress T. [place], [place]

## 2019-05-20 ENCOUNTER — Other Ambulatory Visit: Payer: Self-pay | Admitting: Pulmonary Disease

## 2019-06-14 ENCOUNTER — Ambulatory Visit: Payer: Medicare Other | Attending: Internal Medicine

## 2019-06-14 DIAGNOSIS — Z23 Encounter for immunization: Secondary | ICD-10-CM

## 2019-06-14 NOTE — Progress Notes (Signed)
   Covid-19 Vaccination Clinic  Name:  Antonio Andrade    MRN: [id number] DOB: 03/06/1951  06/14/2019  Mr. Antonio Andrade was observed post Covid-19 immunization for 15 minutes without incidence. He was provided with Vaccine Information Sheet and instruction to access the V-Safe system.   Mr. Antonio Andrade was instructed to call 911 with any severe reactions post vaccine: Marland Kitchen Difficulty breathing  . Swelling of your face and throat  . A fast heartbeat  . A bad rash all over your body  . Dizziness and weakness    Immunizations Administered    Name Date Dose VIS Date Route   Pfizer COVID-19 Vaccine 06/14/2019 12:57 PM 0.3 mL 03/30/2019 Intramuscular   Manufacturer: [place]   Lot: [id number]   [place]: [id number]

## 2019-07-10 ENCOUNTER — Ambulatory Visit: Payer: Medicare Other | Attending: Internal Medicine

## 2019-07-10 DIAGNOSIS — Z23 Encounter for immunization: Secondary | ICD-10-CM

## 2019-07-10 NOTE — Progress Notes (Signed)
   Covid-19 Vaccination Clinic  Name:  Antonio Andrade    MRN: [id number] DOB: 10/24/1950  07/10/2019  Antonio Andrade was observed post Covid-19 immunization for 15 minutes without incident. He was provided with Vaccine Information Sheet and instruction to access the V-Safe system.   Antonio Andrade was instructed to call 911 with any severe reactions post vaccine: Marland Kitchen Difficulty breathing  . Swelling of face and throat  . A fast heartbeat  . A bad rash all over body  . Dizziness and weakness   Immunizations Administered    Name Date Dose VIS Date Route   Pfizer COVID-19 Vaccine 07/10/2019  8:57 AM 0.3 mL 03/30/2019 Intramuscular   Manufacturer: [place]   Lot: [id number]   [place]: [id number]

## 2019-07-19 ENCOUNTER — Ambulatory Visit: Payer: Medicare Other | Admitting: Podiatry

## 2019-11-15 ENCOUNTER — Other Ambulatory Visit: Payer: Self-pay | Admitting: Pulmonary Disease

## 2019-11-25 ENCOUNTER — Other Ambulatory Visit: Payer: Self-pay | Admitting: Pulmonary Disease

## 2020-05-13 DIAGNOSIS — R42 Dizziness and giddiness: Secondary | ICD-10-CM | POA: Diagnosis not present

## 2020-05-13 DIAGNOSIS — E785 Hyperlipidemia, unspecified: Secondary | ICD-10-CM | POA: Diagnosis not present

## 2020-05-13 DIAGNOSIS — Z23 Encounter for immunization: Secondary | ICD-10-CM | POA: Diagnosis not present

## 2020-05-13 DIAGNOSIS — Z0000 Encounter for general adult medical examination without abnormal findings: Secondary | ICD-10-CM | POA: Diagnosis not present

## 2020-05-13 DIAGNOSIS — R3912 Poor urinary stream: Secondary | ICD-10-CM | POA: Diagnosis not present

## 2020-05-13 DIAGNOSIS — R490 Dysphonia: Secondary | ICD-10-CM | POA: Diagnosis not present

## 2020-05-13 DIAGNOSIS — J45909 Unspecified asthma, uncomplicated: Secondary | ICD-10-CM | POA: Diagnosis not present

## 2020-05-13 DIAGNOSIS — I10 Essential (primary) hypertension: Secondary | ICD-10-CM | POA: Diagnosis not present

## 2020-05-13 DIAGNOSIS — E1169 Type 2 diabetes mellitus with other specified complication: Secondary | ICD-10-CM | POA: Diagnosis not present

## 2020-08-13 DIAGNOSIS — J45909 Unspecified asthma, uncomplicated: Secondary | ICD-10-CM | POA: Diagnosis not present

## 2020-08-13 DIAGNOSIS — I10 Essential (primary) hypertension: Secondary | ICD-10-CM | POA: Diagnosis not present

## 2020-08-13 DIAGNOSIS — E1169 Type 2 diabetes mellitus with other specified complication: Secondary | ICD-10-CM | POA: Diagnosis not present

## 2020-08-13 DIAGNOSIS — R42 Dizziness and giddiness: Secondary | ICD-10-CM | POA: Diagnosis not present

## 2020-08-13 DIAGNOSIS — E039 Hypothyroidism, unspecified: Secondary | ICD-10-CM | POA: Diagnosis not present

## 2020-08-13 DIAGNOSIS — G4700 Insomnia, unspecified: Secondary | ICD-10-CM | POA: Diagnosis not present

## 2020-08-22 ENCOUNTER — Encounter: Payer: Self-pay | Admitting: Neurology

## 2020-10-31 NOTE — Progress Notes (Signed)
NEUROLOGY CONSULTATION NOTE  [name] MRN: [id number] DOB: 1950-06-21  Referring provider: London Pepper, MD Primary care provider: London Pepper, MD  Reason for consult:  vertigo  Assessment/Plan:   Persistent dizziness - Most importantly will evaluate for vertebrobasilar insufficiency/posterior fossa/cerebellar lesion.  He does have some mild left upper cervical myofascial pain but no significant neck pain to suggest cervicogenic etiology.  Not consistent with migraine.  No preceding viral illness to suggest vestibular neuritis.  Also consider persistent postural-positional dizziness, which would be diagnosis of exclusion  MRI of brain w/wo and MRA of head to rule out cerebellar/posterior fossa lesion and vertebrobasilar insufficiency Further recommendations pending results.  Management likely would be vestibular rehab.    Subjective:  Antonio Andrade is a 70 year old right-handed male with HTN, DM II, Bipolar disorder and asthma who presents for vertigo.  History supplemented by referring provider's notes.  Started having vertigo 11 months ago.  No preceding trauma, no change in medication, or viral illness.  It occurs daily all day long.  Any movement aggravates it, particularly turning.  Sudden onset.  He doesn't know what he did.  He has a sensation of dizziness but also spinning sensation as well.  He is wobbly on his feet and needs to hold on when ambulating.  Initially he had accompanying nausea but that resolved several months ago.  He has had some double vision.  He feels lethargic.  No worsening hearing loss or tinnitus.  No associated headache but he feels spasms in the left nuchal region for several years.  No prior history of vertigo.       PAST MEDICAL HISTORY: Past Medical History:  Diagnosis Date   Asthma    Diabetes mellitus without complication ([place])    Hypertension     PAST SURGICAL HISTORY: Past Surgical History:  Procedure Laterality Date   CHOLECYSTECTOMY       MEDICATIONS: Current Outpatient Medications on File Prior to Visit  Medication Sig Dispense Refill   ARIPiprazole (ABILIFY) 2 MG tablet Take 2 mg by mouth daily.     atorvastatin (LIPITOR) 40 MG tablet Take 40 mg by mouth daily.     Blood Glucose Monitoring Suppl (ONETOUCH VERIO FLEX SYSTEM) w/Device KIT      cetirizine (ZYRTEC) 10 MG tablet Take 10 mg by mouth daily.     Clobetasol Propionate (CLOBEX) 0.05 % lotion Apply topically 2 (two) times daily.     fexofenadine (ALLEGRA) 180 MG tablet Take 180 mg by mouth daily.     fluticasone (FLONASE) 50 MCG/ACT nasal spray Place into both nostrils daily.     Fluticasone-Salmeterol (ADVAIR DISKUS) 250-50 MCG/DOSE AEPB Inhale 1 puff into the lungs 2 (two) times daily.     gabapentin (NEURONTIN) 300 MG capsule Take 300 mg by mouth 2 (two) times daily.     levothyroxine (SYNTHROID) 88 MCG tablet 100 mcg.      loratadine (CLARITIN) 10 MG tablet Take 10 mg by mouth daily.     metFORMIN (GLUCOPHAGE) 1000 MG tablet Take 1,000 mg by mouth 2 (two) times daily.     montelukast (SINGULAIR) 10 MG tablet TAKE 1 TABLET BY MOUTH EVERYDAY AT BEDTIME 90 tablet 1   Multiple Vitamins-Minerals (OCUVITE EYE + MULTI) TABS Take 1 tablet by mouth daily.     omeprazole (PRILOSEC) 20 MG capsule Take 20 mg by mouth daily.     ONETOUCH VERIO test strip      OZEMPIC, 1 MG/DOSE, 2 MG/1.5ML SOPN  perphenazine (TRILAFON) 2 MG tablet Take 2 mg by mouth at bedtime.     PROAIR HFA 108 (90 Base) MCG/ACT inhaler Inhale 2-4 puffs into the lungs. Every 4 to 6 hours as needed for wheezing or shortness of breath     sertraline (ZOLOFT) 100 MG tablet Take 100 mg by mouth daily.     tamsulosin (FLOMAX) 0.4 MG CAPS capsule      telmisartan (MICARDIS) 80 MG tablet Take 80 mg by mouth daily.     temazepam (RESTORIL) 15 MG capsule      traZODone (DESYREL) 100 MG tablet Take 100 mg by mouth at bedtime.     triamcinolone cream (KENALOG) 0.1 % Apply 1 application topically.      No  current facility-administered medications on file prior to visit.    ALLERGIES: Allergies  Allergen Reactions   Penicillin G Nausea And Vomiting    Has patient had a PCN reaction causing immediate rash, facial/tongue/throat swelling, SOB or lightheadedness with hypotension: UNKNOWN Has patient had a PCN reaction causing severe rash involving mucus membranes or skin necrosis: Unknown Has patient had a PCN reaction that required hospitalization: Unknown Has patient had a PCN reaction occurring within the last 10 years: Unknown If all of the above answers are "NO", then may proceed with Cephalosporin use.    Sulfa Antibiotics Itching   Sulfamethoxazole Itching   Amoxicillin Nausea Only    Has patient had a PCN reaction causing immediate rash, facial/tongue/throat swelling, SOB or lightheadedness with hypotension:No Has patient had a PCN reaction causing severe rash involving mucus membranes or skin necrosis: No Has patient had a PCN reaction that required hospitalization: No Has patient had a PCN reaction occurring within the last 10 years: No If all of the above answers are "NO", then may proceed with Cephalosporin use.     FAMILY HISTORY: Family History  Problem Relation Age of Onset   CAD Father    Hypertension Father    Diabetes Father    Cancer Neg Hx    Stroke Neg Hx     Objective:  Blood pressure 125/69, pulse 83, height '5\' 10"'  (1.778 m), weight 262 lb (118.8 kg), SpO2 94 %. General: No acute distress.  Patient appears well-groomed.   Head:  Normocephalic/atraumatic Eyes:  fundi examined but not visualized Neck: supple, no paraspinal tenderness, full range of motion Back: No paraspinal tenderness Heart: regular rate and rhythm Lungs: Clear to auscultation bilaterally. Vascular: No carotid bruits. Neurological Exam: Mental status: alert and oriented to person, place, and time, recent and remote memory intact, fund of knowledge intact, attention and concentration  intact, speech fluent and not dysarthric, language intact. Cranial nerves: CN I: not tested CN II: pupils equal, round and reactive to light, visual fields intact CN III, IV, VI:  full range of motion, no nystagmus, no ptosis CN V: facial sensation intact. CN VII: upper and lower face symmetric CN VIII: hearing intact CN IX, X: gag intact, uvula midline CN XI: sternocleidomastoid and trapezius muscles intact CN XII: tongue midline Bulk & Tone: normal, no fasciculations. Motor:  muscle strength 5/5 throughout.  Very mild postural and kinetic tremor in hands. Sensation:  Pinprick sensation intact.  Reduced vibratory sensation in feet. Deep Tendon Reflexes:  2+ throughout,  toes downgoing.   Finger to nose testing:  Without dysmetria.   Heel to shin:  Without dysmetria.   Gait:  Wide-based unsteady gait.  Requires cane for assistance.  Romberg with sway.    Thank you  for allowing me to take part in the care of this patient.  Metta Clines, DO  CC: London Pepper, MD

## 2020-11-03 ENCOUNTER — Other Ambulatory Visit: Payer: Self-pay

## 2020-11-03 ENCOUNTER — Ambulatory Visit (INDEPENDENT_AMBULATORY_CARE_PROVIDER_SITE_OTHER): Payer: Medicare Other | Admitting: Neurology

## 2020-11-03 ENCOUNTER — Encounter: Payer: Self-pay | Admitting: Neurology

## 2020-11-03 VITALS — BP 125/69 | HR 83 | Ht 70.0 in | Wt 262.0 lb

## 2020-11-03 DIAGNOSIS — G450 Vertebro-basilar artery syndrome: Secondary | ICD-10-CM

## 2020-11-03 DIAGNOSIS — R42 Dizziness and giddiness: Secondary | ICD-10-CM

## 2020-11-03 NOTE — Patient Instructions (Signed)
Will check MRI of brain with and without contrast and MRA of  head  Follow up after testing

## 2020-11-14 DIAGNOSIS — E119 Type 2 diabetes mellitus without complications: Secondary | ICD-10-CM | POA: Diagnosis not present

## 2020-11-14 DIAGNOSIS — H2513 Age-related nuclear cataract, bilateral: Secondary | ICD-10-CM | POA: Diagnosis not present

## 2020-11-14 DIAGNOSIS — H35033 Hypertensive retinopathy, bilateral: Secondary | ICD-10-CM | POA: Diagnosis not present

## 2020-11-14 DIAGNOSIS — H0102B Squamous blepharitis left eye, upper and lower eyelids: Secondary | ICD-10-CM | POA: Diagnosis not present

## 2020-11-14 DIAGNOSIS — H0102A Squamous blepharitis right eye, upper and lower eyelids: Secondary | ICD-10-CM | POA: Diagnosis not present

## 2020-11-14 DIAGNOSIS — H04123 Dry eye syndrome of bilateral lacrimal glands: Secondary | ICD-10-CM | POA: Diagnosis not present

## 2020-11-14 DIAGNOSIS — H40013 Open angle with borderline findings, low risk, bilateral: Secondary | ICD-10-CM | POA: Diagnosis not present

## 2020-11-20 ENCOUNTER — Other Ambulatory Visit: Payer: Self-pay

## 2020-11-20 ENCOUNTER — Ambulatory Visit
Admission: RE | Admit: 2020-11-20 | Discharge: 2020-11-20 | Disposition: A | Discharge status: Home / Self Care | Payer: Medicare Other | Source: Ambulatory Visit | Attending: Neurology | Admitting: Neurology

## 2020-11-20 DIAGNOSIS — G450 Vertebro-basilar artery syndrome: Secondary | ICD-10-CM | POA: Diagnosis not present

## 2020-11-20 DIAGNOSIS — R42 Dizziness and giddiness: Secondary | ICD-10-CM

## 2020-11-20 MED ORDER — GADOBENATE DIMEGLUMINE 529 MG/ML IV SOLN
19.0000 mL | Freq: Once | INTRAVENOUS | Status: AC | PRN
  Administered 2020-11-20: 19 mL via INTRAVENOUS

## 2020-11-26 ENCOUNTER — Telehealth: Payer: Self-pay | Admitting: Neurology

## 2020-11-26 NOTE — Telephone Encounter (Signed)
Pt called in and left a message wanting to find out his MRI results

## 2020-11-27 DIAGNOSIS — R519 Headache, unspecified: Secondary | ICD-10-CM | POA: Diagnosis not present

## 2020-11-27 DIAGNOSIS — Z20822 Contact with and (suspected) exposure to covid-19: Secondary | ICD-10-CM | POA: Diagnosis not present

## 2020-11-27 NOTE — Telephone Encounter (Signed)
See results note. 

## 2020-11-28 DIAGNOSIS — Z20822 Contact with and (suspected) exposure to covid-19: Secondary | ICD-10-CM | POA: Diagnosis not present

## 2020-12-04 ENCOUNTER — Telehealth: Payer: Self-pay | Admitting: Neurology

## 2020-12-04 NOTE — Telephone Encounter (Signed)
Pt called in and left a message stating he was returning a call to [place].

## 2020-12-04 NOTE — Telephone Encounter (Signed)
Patient returned call for results 

## 2020-12-05 NOTE — Telephone Encounter (Signed)
Per Dr.Jaffe results note pt needs a Pt for vestibular rehab.   LMOvM for the pt to give the office a call back.

## 2021-01-07 DIAGNOSIS — H35033 Hypertensive retinopathy, bilateral: Secondary | ICD-10-CM | POA: Diagnosis not present

## 2021-01-07 DIAGNOSIS — H2511 Age-related nuclear cataract, right eye: Secondary | ICD-10-CM | POA: Diagnosis not present

## 2021-01-07 DIAGNOSIS — Z9889 Other specified postprocedural states: Secondary | ICD-10-CM | POA: Diagnosis not present

## 2021-01-07 DIAGNOSIS — E119 Type 2 diabetes mellitus without complications: Secondary | ICD-10-CM | POA: Diagnosis not present

## 2021-01-07 DIAGNOSIS — H04123 Dry eye syndrome of bilateral lacrimal glands: Secondary | ICD-10-CM | POA: Diagnosis not present

## 2021-01-07 DIAGNOSIS — H40013 Open angle with borderline findings, low risk, bilateral: Secondary | ICD-10-CM | POA: Diagnosis not present

## 2021-01-07 DIAGNOSIS — H0102B Squamous blepharitis left eye, upper and lower eyelids: Secondary | ICD-10-CM | POA: Diagnosis not present

## 2021-01-07 DIAGNOSIS — H0102A Squamous blepharitis right eye, upper and lower eyelids: Secondary | ICD-10-CM | POA: Diagnosis not present

## 2021-01-10 DIAGNOSIS — S61011A Laceration without foreign body of right thumb without damage to nail, initial encounter: Secondary | ICD-10-CM | POA: Diagnosis not present

## 2021-01-20 DIAGNOSIS — I10 Essential (primary) hypertension: Secondary | ICD-10-CM | POA: Diagnosis not present

## 2021-01-20 DIAGNOSIS — T798XXA Other early complications of trauma, initial encounter: Secondary | ICD-10-CM | POA: Diagnosis not present

## 2021-01-20 DIAGNOSIS — R42 Dizziness and giddiness: Secondary | ICD-10-CM | POA: Diagnosis not present

## 2021-01-20 DIAGNOSIS — T8130XA Disruption of wound, unspecified, initial encounter: Secondary | ICD-10-CM | POA: Diagnosis not present

## 2021-01-20 DIAGNOSIS — S61011D Laceration without foreign body of right thumb without damage to nail, subsequent encounter: Secondary | ICD-10-CM | POA: Diagnosis not present

## 2021-01-20 DIAGNOSIS — E1169 Type 2 diabetes mellitus with other specified complication: Secondary | ICD-10-CM | POA: Diagnosis not present

## 2021-01-20 DIAGNOSIS — E119 Type 2 diabetes mellitus without complications: Secondary | ICD-10-CM | POA: Diagnosis not present

## 2021-01-20 DIAGNOSIS — G4733 Obstructive sleep apnea (adult) (pediatric): Secondary | ICD-10-CM | POA: Diagnosis not present

## 2021-01-21 DIAGNOSIS — G4733 Obstructive sleep apnea (adult) (pediatric): Secondary | ICD-10-CM | POA: Diagnosis not present

## 2021-02-03 NOTE — Progress Notes (Deleted)
NEUROLOGY FOLLOW UP OFFICE NOTE  Antonio Andrade [id number]  Assessment/Plan:   ***  Subjective:  Antonio Andrade is a 70 year old right-handed male with HTN, DM II, Bipolar disorder and asthma who follows up for vertigo.  UPDATE: MRI of brain with and without contrast and MRA of head on 11/20/2020 personally reviewed and showed mild chronic small vessel ischemic changes but no stroke, posterior fossa mass lesion or vertebrobasilar insufficiency.  Referred to vestibular rehab ***   HISTORY: Started having vertigo in the summer of 2021.  No preceding trauma, no change in medication, or viral illness.  It occurs daily all day long.  Any movement aggravates it, particularly turning.  Sudden onset.  He doesn't know what he did.  He has a sensation of dizziness but also spinning sensation as well.  He is wobbly on his feet and needs to hold on when ambulating.  Initially he had accompanying nausea but that resolved several months ago.  He has had some double vision.  He feels lethargic.  No worsening hearing loss or tinnitus.  No associated headache but he feels spasms in the left nuchal region for several years.  No prior history of vertigo.    PAST MEDICAL HISTORY: Past Medical History:  Diagnosis Date   Asthma    Diabetes mellitus without complication ([place])    Hypertension     MEDICATIONS: Current Outpatient Medications on File Prior to Visit  Medication Sig Dispense Refill   ARIPiprazole (ABILIFY) 2 MG tablet Take 2 mg by mouth daily.     atorvastatin (LIPITOR) 40 MG tablet Take 40 mg by mouth daily.     Blood Glucose Monitoring Suppl (ONETOUCH VERIO FLEX SYSTEM) w/Device KIT      cetirizine (ZYRTEC) 10 MG tablet Take 10 mg by mouth daily. (Patient not taking: Reported on 11/03/2020)     Clobetasol Propionate 0.05 % lotion Apply topically 2 (two) times daily. (Patient not taking: Reported on 11/03/2020)     fexofenadine (ALLEGRA) 180 MG tablet Take 180 mg by mouth daily.     fluticasone  (FLONASE) 50 MCG/ACT nasal spray Place into both nostrils daily.     Fluticasone-Salmeterol (ADVAIR) 250-50 MCG/DOSE AEPB Inhale 1 puff into the lungs 2 (two) times daily.     gabapentin (NEURONTIN) 300 MG capsule Take 300 mg by mouth 2 (two) times daily. (Patient not taking: Reported on 11/03/2020)     levothyroxine (SYNTHROID) 88 MCG tablet 100 mcg.      loratadine (CLARITIN) 10 MG tablet Take 10 mg by mouth daily.     metFORMIN (GLUCOPHAGE) 1000 MG tablet Take 1,000 mg by mouth 2 (two) times daily. (Patient not taking: Reported on 11/03/2020)     montelukast (SINGULAIR) 10 MG tablet TAKE 1 TABLET BY MOUTH EVERYDAY AT BEDTIME (Patient not taking: Reported on 11/03/2020) 90 tablet 1   Multiple Vitamins-Minerals (OCUVITE EYE + MULTI) TABS Take 1 tablet by mouth daily.     omeprazole (PRILOSEC) 20 MG capsule Take 20 mg by mouth daily.     ONETOUCH VERIO test strip      OZEMPIC, 1 MG/DOSE, 2 MG/1.5ML SOPN      perphenazine (TRILAFON) 2 MG tablet Take 2 mg by mouth at bedtime.     PROAIR HFA 108 (90 Base) MCG/ACT inhaler Inhale 2-4 puffs into the lungs. Every 4 to 6 hours as needed for wheezing or shortness of breath     sertraline (ZOLOFT) 100 MG tablet Take 100 mg by mouth daily.  tamsulosin (FLOMAX) 0.4 MG CAPS capsule      telmisartan (MICARDIS) 80 MG tablet Take 80 mg by mouth daily.     temazepam (RESTORIL) 15 MG capsule  (Patient not taking: Reported on 11/03/2020)     traZODone (DESYREL) 100 MG tablet Take 100 mg by mouth at bedtime.     triamcinolone cream (KENALOG) 0.1 % Apply 1 application topically.      No current facility-administered medications on file prior to visit.    ALLERGIES: Allergies  Allergen Reactions   Penicillin G Nausea And Vomiting    Has patient had a PCN reaction causing immediate rash, facial/tongue/throat swelling, SOB or lightheadedness with hypotension: UNKNOWN Has patient had a PCN reaction causing severe rash involving mucus membranes or skin necrosis:  Unknown Has patient had a PCN reaction that required hospitalization: Unknown Has patient had a PCN reaction occurring within the last 10 years: Unknown If all of the above answers are "NO", then may proceed with Cephalosporin use.    Sulfa Antibiotics Itching   Sulfamethoxazole Itching   Amoxicillin Nausea Only    Has patient had a PCN reaction causing immediate rash, facial/tongue/throat swelling, SOB or lightheadedness with hypotension:No Has patient had a PCN reaction causing severe rash involving mucus membranes or skin necrosis: No Has patient had a PCN reaction that required hospitalization: No Has patient had a PCN reaction occurring within the last 10 years: No If all of the above answers are "NO", then may proceed with Cephalosporin use.     FAMILY HISTORY: Family History  Problem Relation Age of Onset   CAD Father    Hypertension Father    Diabetes Father    Cancer Neg Hx    Stroke Neg Hx       Objective:  *** General: No acute distress.  Patient appears well-groomed.   Head:  Normocephalic/atraumatic Eyes:  Fundi examined but not visualized Neck: supple, no paraspinal tenderness, full range of motion Heart:  Regular rate and rhythm Lungs:  Clear to auscultation bilaterally Back: No paraspinal tenderness Neurological Exam: alert and oriented to person, place, and time.  Speech fluent and not dysarthric, language intact.  CN II-XII intact. Bulk and tone normal, muscle strength 5/5 throughout.  Sensation to light touch intact.  Deep tendon reflexes 2+ throughout, toes downgoing.  Finger to nose testing intact.  Gait normal, Romberg negative.   Metta Clines, DO  CC: London Pepper, MD

## 2021-02-04 ENCOUNTER — Ambulatory Visit: Payer: Medicare Other | Admitting: Neurology

## 2021-03-10 DIAGNOSIS — E039 Hypothyroidism, unspecified: Secondary | ICD-10-CM | POA: Diagnosis not present

## 2021-03-10 DIAGNOSIS — E559 Vitamin D deficiency, unspecified: Secondary | ICD-10-CM | POA: Diagnosis not present

## 2021-03-10 DIAGNOSIS — I482 Chronic atrial fibrillation, unspecified: Secondary | ICD-10-CM | POA: Diagnosis not present

## 2021-03-10 DIAGNOSIS — E1140 Type 2 diabetes mellitus with diabetic neuropathy, unspecified: Secondary | ICD-10-CM | POA: Diagnosis not present

## 2021-03-10 DIAGNOSIS — E785 Hyperlipidemia, unspecified: Secondary | ICD-10-CM | POA: Diagnosis not present

## 2021-04-20 DIAGNOSIS — H40013 Open angle with borderline findings, low risk, bilateral: Secondary | ICD-10-CM | POA: Diagnosis not present

## 2021-04-20 DIAGNOSIS — H04123 Dry eye syndrome of bilateral lacrimal glands: Secondary | ICD-10-CM | POA: Diagnosis not present

## 2021-04-20 DIAGNOSIS — H35033 Hypertensive retinopathy, bilateral: Secondary | ICD-10-CM | POA: Diagnosis not present

## 2021-04-20 DIAGNOSIS — Z9889 Other specified postprocedural states: Secondary | ICD-10-CM | POA: Diagnosis not present

## 2021-04-20 DIAGNOSIS — H2513 Age-related nuclear cataract, bilateral: Secondary | ICD-10-CM | POA: Diagnosis not present

## 2021-04-20 DIAGNOSIS — H0102A Squamous blepharitis right eye, upper and lower eyelids: Secondary | ICD-10-CM | POA: Diagnosis not present

## 2021-04-20 DIAGNOSIS — H0102B Squamous blepharitis left eye, upper and lower eyelids: Secondary | ICD-10-CM | POA: Diagnosis not present

## 2021-04-20 DIAGNOSIS — E119 Type 2 diabetes mellitus without complications: Secondary | ICD-10-CM | POA: Diagnosis not present

## 2021-04-23 DIAGNOSIS — H2511 Age-related nuclear cataract, right eye: Secondary | ICD-10-CM | POA: Diagnosis not present

## 2021-04-23 DIAGNOSIS — H5703 Miosis: Secondary | ICD-10-CM | POA: Diagnosis not present

## 2021-05-05 DIAGNOSIS — G4733 Obstructive sleep apnea (adult) (pediatric): Secondary | ICD-10-CM | POA: Diagnosis not present

## 2021-05-29 DIAGNOSIS — Z7984 Long term (current) use of oral hypoglycemic drugs: Secondary | ICD-10-CM | POA: Diagnosis not present

## 2021-05-29 DIAGNOSIS — R29898 Other symptoms and signs involving the musculoskeletal system: Secondary | ICD-10-CM | POA: Diagnosis not present

## 2021-05-29 DIAGNOSIS — R42 Dizziness and giddiness: Secondary | ICD-10-CM | POA: Diagnosis not present

## 2021-05-29 DIAGNOSIS — J45909 Unspecified asthma, uncomplicated: Secondary | ICD-10-CM | POA: Diagnosis not present

## 2021-05-29 DIAGNOSIS — R002 Palpitations: Secondary | ICD-10-CM | POA: Diagnosis not present

## 2021-05-29 DIAGNOSIS — R3912 Poor urinary stream: Secondary | ICD-10-CM | POA: Diagnosis not present

## 2021-05-29 DIAGNOSIS — E1169 Type 2 diabetes mellitus with other specified complication: Secondary | ICD-10-CM | POA: Diagnosis not present

## 2021-05-29 DIAGNOSIS — I10 Essential (primary) hypertension: Secondary | ICD-10-CM | POA: Diagnosis not present

## 2021-05-29 DIAGNOSIS — E785 Hyperlipidemia, unspecified: Secondary | ICD-10-CM | POA: Diagnosis not present

## 2021-05-29 DIAGNOSIS — Z1211 Encounter for screening for malignant neoplasm of colon: Secondary | ICD-10-CM | POA: Diagnosis not present

## 2021-05-29 DIAGNOSIS — Z0000 Encounter for general adult medical examination without abnormal findings: Secondary | ICD-10-CM | POA: Diagnosis not present

## 2021-05-29 DIAGNOSIS — Z8673 Personal history of transient ischemic attack (TIA), and cerebral infarction without residual deficits: Secondary | ICD-10-CM | POA: Diagnosis not present

## 2021-06-02 DIAGNOSIS — H1820 Unspecified corneal edema: Secondary | ICD-10-CM | POA: Diagnosis not present

## 2021-06-02 DIAGNOSIS — H2512 Age-related nuclear cataract, left eye: Secondary | ICD-10-CM | POA: Diagnosis not present

## 2021-06-02 DIAGNOSIS — H40032 Anatomical narrow angle, left eye: Secondary | ICD-10-CM | POA: Diagnosis not present

## 2021-06-02 DIAGNOSIS — E119 Type 2 diabetes mellitus without complications: Secondary | ICD-10-CM | POA: Diagnosis not present

## 2021-06-05 DIAGNOSIS — G4733 Obstructive sleep apnea (adult) (pediatric): Secondary | ICD-10-CM | POA: Diagnosis not present

## 2021-06-30 ENCOUNTER — Ambulatory Visit (INDEPENDENT_AMBULATORY_CARE_PROVIDER_SITE_OTHER): Payer: Medicare Other

## 2021-06-30 ENCOUNTER — Encounter: Payer: Self-pay | Admitting: Internal Medicine

## 2021-06-30 ENCOUNTER — Other Ambulatory Visit: Payer: Self-pay

## 2021-06-30 ENCOUNTER — Ambulatory Visit: Payer: Medicare Other | Admitting: Internal Medicine

## 2021-06-30 VITALS — BP 118/70 | HR 87 | Ht 70.0 in | Wt 251.8 lb

## 2021-06-30 DIAGNOSIS — I10 Essential (primary) hypertension: Secondary | ICD-10-CM

## 2021-06-30 DIAGNOSIS — I5032 Chronic diastolic (congestive) heart failure: Secondary | ICD-10-CM | POA: Diagnosis not present

## 2021-06-30 DIAGNOSIS — R002 Palpitations: Secondary | ICD-10-CM | POA: Diagnosis not present

## 2021-06-30 NOTE — Patient Instructions (Signed)
Medication Instructions:  ?Your physician recommends that you continue on your current medications as directed. Please refer to the Current Medication list given to you today. ? ?*If you need a refill on your cardiac medications before your next appointment, please call your pharmacy* ? ? ?Lab Work: ?none ?If you have labs (blood work) drawn today and your tests are completely normal, you will receive your results only by: ?MyChart Message (if you have MyChart) OR ?A paper copy in the mail ?If you have any lab test that is abnormal or we need to change your treatment, we will call you to review the results. ? ? ?Testing/Procedures: ?Your physician has recommended that you wear an event monitor. Event monitors are medical devices that record the heart?s electrical activity. Doctors most often [place] these monitors to diagnose arrhythmias. Arrhythmias are problems with the speed or rhythm of the heartbeat. The monitor is a small, portable device. You can wear one while you do your normal daily activities. This is usually used to diagnose what is causing palpitations/syncope (passing out). ? ? ?Your physician has requested that you have an echocardiogram. Echocardiography is a painless test that uses sound waves to create images of your heart. It provides your doctor with information about the size and shape of your heart and how well your heart?s chambers and valves are working. This procedure takes approximately one hour. There are no restrictions for this procedure. ? ? ? ?Follow-Up: ?At [facility], you and your health needs are our priority.  As part of our continuing mission to provide you with exceptional heart care, we have created designated Provider Care Teams.  These Care Teams include your primary Cardiologist (physician) and Advanced Practice Providers (APPs -  Physician Assistants and Nurse Practitioners) who all work together to provide you with the care you need, when you need it. ? ?We recommend signing  up for the patient portal called "MyChart".  Sign up information is provided on this After Visit Summary.  MyChart is used to connect with patients for Virtual Visits (Telemedicine).  Patients are able to view lab/test results, encounter notes, upcoming appointments, etc.  Non-urgent messages can be sent to your provider as well.   ?To learn more about what you can do with MyChart, go to [email or web address].   ? ?Your next appointment:   ?1 year(s) ? ?The format for your next appointment:   ?In Person ? ?Provider:  Dr. Dorris Carnes  ?

## 2021-06-30 NOTE — Progress Notes (Unsigned)
[id number] from office inventory applied to patients. ?

## 2021-06-30 NOTE — Progress Notes (Signed)
? ?Cardiology Office Note ? ? ?Date:  06/30/2021  ? ?ID:  Antonio Andrade, DOB [birth year], MRN [id number] ? ?PCP:  London Pepper, MD  ?Cardiologist:   Dorris Carnes, MD  ? ? ?Patient referred for eval of palpitations    ?  ?History of Present Illness: ?Antonio Andrade is a 71 y.o. male with a history of  HTN    HL   Referred for eval of palpitations    ?Alroy Dust   Bosh    ?In 2006 he was seen by Milon Dikes in [place]   heart racing   he flet lstless.   Found to be in afib    Underwent cardioversion   Got to feeling better     ?Had recurrence he said   No EKG available    ? ?Denies CP   No SOB     Is unsteady on his feet chronically   Vertigo      Says he does not have a lot of energy but denies CP with activity   ? ?Last lipids  LDL 64     ? ? ?Current Meds  ?Medication Sig  ? atorvastatin (LIPITOR) 40 MG tablet Take 40 mg by mouth daily.  ? Blood Glucose Monitoring Suppl ([place]) w/Device KIT   ? cetirizine (ZYRTEC) 10 MG tablet Take 10 mg by mouth daily.  ? fexofenadine (ALLEGRA) 180 MG tablet Take 180 mg by mouth daily.  ? fluticasone (FLONASE) 50 MCG/ACT nasal spray Place into both nostrils daily.  ? Fluticasone-Salmeterol (ADVAIR) 250-50 MCG/DOSE AEPB Inhale 1 puff into the lungs 2 (two) times daily.  ? JARDIANCE 25 MG TABS tablet Take 25 mg by mouth daily.  ? levothyroxine (SYNTHROID) 88 MCG tablet 100 mcg.   ? loratadine (CLARITIN) 10 MG tablet Take 10 mg by mouth daily.  ? montelukast (SINGULAIR) 10 MG tablet TAKE 1 TABLET BY MOUTH EVERYDAY AT BEDTIME  ? omeprazole (PRILOSEC) 20 MG capsule Take 20 mg by mouth daily.  ? ONETOUCH VERIO test strip   ? OZEMPIC, 1 MG/DOSE, 2 MG/1.5ML SOPN   ? PROAIR HFA 108 (90 Base) MCG/ACT inhaler Inhale 2-4 puffs into the lungs. Every 4 to 6 hours as needed for wheezing or shortness of breath  ? sertraline (ZOLOFT) 100 MG tablet Take 100 mg by mouth daily.  ? tamsulosin (FLOMAX) 0.4 MG CAPS capsule   ? telmisartan (MICARDIS) 80 MG tablet Take 80 mg by mouth daily.   ? Vitamin D, Ergocalciferol, (DRISDOL) 1.25 MG (50000 UNIT) CAPS capsule Take 50,000 Units by mouth once a week.  ? ? ? ?Allergies:   Sulfa antibiotics, Sulfamethoxazole, and Amoxicillin  ? ?Past Medical History:  ?Diagnosis Date  ? Acid reflux   ? Asthma   ? Atrial fibrillation ([place])   ? Bipolar 1 disorder ([place])   ? BPH (benign prostatic hyperplasia)   ? C. difficile colitis   ? Depression   ? Diabetes mellitus without complication ([place])   ? Fatty liver   ? Fibromyalgia   ? Hyperlipidemia   ? Hypertension   ? Hypertensive retinopathy   ? Hypothyroidism   ? Insomnia   ? Low vitamin B12 level   ? Neuropathy   ? Obesity   ? Positive FIT (fecal immunochemical test)   ? ? ?Past Surgical History:  ?Procedure Laterality Date  ? CHOLECYSTECTOMY    ? ? ? ?Social History:  The patient  reports that he has never smoked. He has never used smokeless tobacco.  He reports that he does not drink alcohol and does not use drugs.  ? ?Family History:  The patient's family history includes CAD in his father; Diabetes in his father; Hypertension in his father.  ? ? ?ROS:  Please see the history of present illness. All other systems are reviewed and  Negative to the above problem except as noted.  ? ? ?PHYSICAL EXAM: ?VS:  BP 118/70 (BP Location: Right Arm, Patient Position: Sitting, Cuff Size: Normal)   Pulse 87   Ht _0  (1.778 m)   Wt 251 lb 12.8 oz (114.2 kg)   SpO2 97%   BMI 36.13 kg/m?   ?GEN: Obese 71 yo , in no acute distress  ?HEENT: normal  ?Neck: no JVD, carotid bruits, ?Cardiac: RRR; no murmurs,TR LE  edema  ?Respiratory:  clear to auscultation bilaterally, normal work of breathing ?GI: soft, nontender, nondistended, + BS  No hepatomegaly  ?MS: no deformity Moving all extremities   ?Skin: warm and dry, no rash ?Neuro:  Strength and sensation are intact ?Psych: euthymic mood, full affect ? ? ?EKG:  EKG is ordered today.  NSR   77 ? ? ?Lipid Panel ?No results found for: CHOL, TRIG, HDL, CHOLHDL, VLDL, LDLCALC,  LDLDIRECT ?  ? ?Wt Readings from Last 3 Encounters:  ?06/30/21 251 lb 12.8 oz (114.2 kg)  ?11/03/20 262 lb (118.8 kg)  ?09/25/18 269 lb 12.8 oz (122.4 kg)  ?  ? ? ?ASSESSMENT AND PLAN: ? ?1 PAF  Pt had one episode of documented afib  had cardioversion   Sensed palpitatins at time   Nothinglike that since     ?Will get echo to evaluate atrial   ?Will set up for event monitor     ?Keep on same meds for now  ? ?2  HTn  Good control ? ?3  HL  LDL was very good   64  ? ?4    DM  On meds   A1C 6.5 ? ?5  Hx vertigo   ? ?F/U in 1 year   Sooner if real palpitatoins deveilop  ? ? ?Current medicines are reviewed at length with the patient today.  The patient does not have concerns regarding medicines. ? ?Signed, ?Dorris Carnes, MD  ?06/30/2021 11:21 AM    ?[facility] ?[address], [place], [place]  [address] ?Phone: [phone]; Fax: [phone]  ? ? ?

## 2021-07-03 ENCOUNTER — Telehealth: Payer: Self-pay | Admitting: Student

## 2021-07-03 DIAGNOSIS — G4733 Obstructive sleep apnea (adult) (pediatric): Secondary | ICD-10-CM | POA: Diagnosis not present

## 2021-07-03 NOTE — Telephone Encounter (Signed)
? ?  Cardiac Monitor Alert ? ?Date of alert:  07/03/2021  ? ?Patient Name: Antonio Andrade  ?DOB: Aug 31, 1950  ?MRN: [id number]  ? ?[facility] Cardiologist: None  ?[facility] EP:  None   ? ?Monitor Information: ?Cardiac Event Monitor [[employer]]  ?Reason:  8 second run of NSVT  ?Ordering provider:  Dr. Dorris Carnes  ?  ?Alert ?Ventricular Tachycardia ?This is the 1st alert for this rhythm.  ? ?The patient was contacted today.  He is asymptomatic. ?Plan:  Pt to continue to monitor and will review with Dr. Dorris Carnes 07/06/21.   ? ? ? ?

## 2021-07-03 NOTE — Telephone Encounter (Signed)
Spoke with the pt and he reports that he has been feeling well... he was unaware of the event.... he denies dizziness, SOB... no palpitations... I advised him to let me know if anything changes... I will forward to Dr. Harrington Challenger fore her review.  ?

## 2021-07-03 NOTE — Telephone Encounter (Signed)
? ?  Received a page from [place] [facility] about critical EKG.  I called and spoke with [employer] staff and was notified that patient had an 8-second run of nonsustained VT this morning around 2:10 AM CST.  Otherwise baseline rhythm sinus bradycardia with rate of 55 bpm and first-degree AV block.  I had strips faxed to me and it does look like nonsustained VT with a wide-complex QRS regular rhythm.  This was an automatic detection.  Device company tried to call patient and did not receive an answer.  I attempted to call patient around 7:50 AM this morning and did not receive an answer either. I assume patient was sleeping at the time of the event.  I left a voicemail for patient to call back. He is not on any AV nodal agents.  With baseline sinus rhythm in the 50s, I would not start any at this time.  I would just recommend patient continue to wear the monitor and then further recommendations can be made after full review of monitor results.  ? ?Triage pool, I assume this episode occurred while he was sleeping but do you mind just calling to check on him a little later this morning. Thank you so much! ? ?Darreld Mclean, PA-C ?07/03/2021 8:00 AM ? ? ?

## 2021-07-16 ENCOUNTER — Ambulatory Visit (HOSPITAL_COMMUNITY): Payer: Medicare Other | Attending: Cardiology

## 2021-07-16 DIAGNOSIS — I5032 Chronic diastolic (congestive) heart failure: Secondary | ICD-10-CM | POA: Insufficient documentation

## 2021-07-16 DIAGNOSIS — I10 Essential (primary) hypertension: Secondary | ICD-10-CM | POA: Diagnosis not present

## 2021-07-16 DIAGNOSIS — R002 Palpitations: Secondary | ICD-10-CM | POA: Diagnosis not present

## 2021-07-16 LAB — ECHOCARDIOGRAM COMPLETE
Area-P 1/2: 3.68 cm2
S' Lateral: 2.4 cm

## 2021-07-16 MED ORDER — PERFLUTREN LIPID MICROSPHERE
1.0000 mL | INTRAVENOUS | Status: AC | PRN
  Administered 2021-07-16: 3 mL via INTRAVENOUS

## 2021-08-11 ENCOUNTER — Telehealth: Payer: Self-pay

## 2021-08-11 MED ORDER — METOPROLOL SUCCINATE ER 25 MG PO TB24: 25.0000 mg | ORAL_TABLET | Freq: Every day | ORAL | 3 refills | Status: DC

## 2021-08-11 NOTE — Telephone Encounter (Signed)
-----   Message from Fay Records, MD sent at 08/10/2021  6:16 PM EDT ----- ?Monitor shows sinus rhythm   No atrial fibrillation ?He had rare PVC and PAC    ? 8 seconds had ventricular tach.   Asympomtatic   ?I would add a low dose of Toprol XL to suppress  25 mg  ?Keep following BP at home  ?

## 2021-08-11 NOTE — Telephone Encounter (Signed)
Please call in Rx for Triamcinolone Cream   (steroid cream should help with rash) ?

## 2021-08-11 NOTE — Telephone Encounter (Signed)
Pt verbalized understanding of his monitor results... he says he developed a rash so he did not wear the monitor for about 2 weeks... he says the rash has not become any worse but it is not improving... it is red and pimple like and itchy... no broken skin and no heat or drainage.. he has been using neosporin. He has not taken any antihistamines.  ? ?I advised him to try not to scratch it and to keep it dry.. I will talk with Dr Harrington Challenger for recommendations.  ?

## 2021-08-12 MED ORDER — TRIAMCINOLONE ACETONIDE 0.1 % EX CREA: 1.0000 "application " | TOPICAL_CREAM | Freq: Two times a day (BID) | CUTANEOUS | 0 refills | Status: AC

## 2021-08-12 NOTE — Telephone Encounter (Signed)
Pt advised that we will call in the triamcinolone Cr per Dr Harrington Challenger' recommendations.. he will give it some time and let us know if he has any improvement.  ?

## 2021-08-19 DIAGNOSIS — R42 Dizziness and giddiness: Secondary | ICD-10-CM | POA: Diagnosis not present

## 2021-08-31 DIAGNOSIS — H2512 Age-related nuclear cataract, left eye: Secondary | ICD-10-CM | POA: Diagnosis not present

## 2021-09-03 DIAGNOSIS — H2512 Age-related nuclear cataract, left eye: Secondary | ICD-10-CM | POA: Diagnosis not present

## 2021-09-07 DIAGNOSIS — E039 Hypothyroidism, unspecified: Secondary | ICD-10-CM | POA: Diagnosis not present

## 2021-09-07 DIAGNOSIS — I4891 Unspecified atrial fibrillation: Secondary | ICD-10-CM | POA: Diagnosis not present

## 2021-09-07 DIAGNOSIS — I10 Essential (primary) hypertension: Secondary | ICD-10-CM | POA: Diagnosis not present

## 2021-09-07 DIAGNOSIS — E1169 Type 2 diabetes mellitus with other specified complication: Secondary | ICD-10-CM | POA: Diagnosis not present

## 2021-09-07 DIAGNOSIS — D649 Anemia, unspecified: Secondary | ICD-10-CM | POA: Diagnosis not present

## 2021-09-07 DIAGNOSIS — E785 Hyperlipidemia, unspecified: Secondary | ICD-10-CM | POA: Diagnosis not present

## 2021-09-07 DIAGNOSIS — G4700 Insomnia, unspecified: Secondary | ICD-10-CM | POA: Diagnosis not present

## 2021-09-07 DIAGNOSIS — E538 Deficiency of other specified B group vitamins: Secondary | ICD-10-CM | POA: Diagnosis not present

## 2021-09-07 DIAGNOSIS — K760 Fatty (change of) liver, not elsewhere classified: Secondary | ICD-10-CM | POA: Diagnosis not present

## 2021-09-23 DIAGNOSIS — Z1211 Encounter for screening for malignant neoplasm of colon: Secondary | ICD-10-CM | POA: Diagnosis not present

## 2021-09-23 DIAGNOSIS — K635 Polyp of colon: Secondary | ICD-10-CM | POA: Diagnosis not present

## 2021-09-23 DIAGNOSIS — D123 Benign neoplasm of transverse colon: Secondary | ICD-10-CM | POA: Diagnosis not present

## 2021-09-23 DIAGNOSIS — D122 Benign neoplasm of ascending colon: Secondary | ICD-10-CM | POA: Diagnosis not present

## 2021-09-23 DIAGNOSIS — D120 Benign neoplasm of cecum: Secondary | ICD-10-CM | POA: Diagnosis not present

## 2021-09-23 DIAGNOSIS — E119 Type 2 diabetes mellitus without complications: Secondary | ICD-10-CM | POA: Diagnosis not present

## 2021-09-23 DIAGNOSIS — Z7984 Long term (current) use of oral hypoglycemic drugs: Secondary | ICD-10-CM | POA: Diagnosis not present

## 2021-09-23 DIAGNOSIS — Z86010 Personal history of colonic polyps: Secondary | ICD-10-CM | POA: Diagnosis not present

## 2021-09-23 DIAGNOSIS — Z8673 Personal history of transient ischemic attack (TIA), and cerebral infarction without residual deficits: Secondary | ICD-10-CM | POA: Diagnosis not present

## 2021-10-06 DIAGNOSIS — H8190 Unspecified disorder of vestibular function, unspecified ear: Secondary | ICD-10-CM | POA: Diagnosis not present

## 2021-10-15 DIAGNOSIS — K219 Gastro-esophageal reflux disease without esophagitis: Secondary | ICD-10-CM | POA: Diagnosis not present

## 2021-10-15 DIAGNOSIS — E039 Hypothyroidism, unspecified: Secondary | ICD-10-CM | POA: Diagnosis not present

## 2021-10-15 DIAGNOSIS — E785 Hyperlipidemia, unspecified: Secondary | ICD-10-CM | POA: Diagnosis not present

## 2021-10-15 DIAGNOSIS — E1165 Type 2 diabetes mellitus with hyperglycemia: Secondary | ICD-10-CM | POA: Diagnosis not present

## 2021-10-15 DIAGNOSIS — I10 Essential (primary) hypertension: Secondary | ICD-10-CM | POA: Diagnosis not present

## 2021-10-15 DIAGNOSIS — J45909 Unspecified asthma, uncomplicated: Secondary | ICD-10-CM | POA: Diagnosis not present

## 2021-11-05 ENCOUNTER — Other Ambulatory Visit: Payer: Self-pay | Admitting: Internal Medicine

## 2021-11-09 NOTE — Telephone Encounter (Signed)
Left a message for the pt to discuss refill on Cream that was given to him at last OV for rash from monitor.

## 2021-11-12 DIAGNOSIS — E118 Type 2 diabetes mellitus with unspecified complications: Secondary | ICD-10-CM | POA: Diagnosis not present

## 2021-11-12 DIAGNOSIS — E559 Vitamin D deficiency, unspecified: Secondary | ICD-10-CM | POA: Diagnosis not present

## 2021-11-12 DIAGNOSIS — E785 Hyperlipidemia, unspecified: Secondary | ICD-10-CM | POA: Diagnosis not present

## 2021-11-28 ENCOUNTER — Other Ambulatory Visit: Payer: Self-pay | Admitting: Internal Medicine

## 2021-12-02 DIAGNOSIS — E785 Hyperlipidemia, unspecified: Secondary | ICD-10-CM | POA: Diagnosis not present

## 2021-12-02 DIAGNOSIS — I482 Chronic atrial fibrillation, unspecified: Secondary | ICD-10-CM | POA: Diagnosis not present

## 2021-12-02 DIAGNOSIS — E1165 Type 2 diabetes mellitus with hyperglycemia: Secondary | ICD-10-CM | POA: Diagnosis not present

## 2021-12-02 DIAGNOSIS — E039 Hypothyroidism, unspecified: Secondary | ICD-10-CM | POA: Diagnosis not present

## 2021-12-02 DIAGNOSIS — E1140 Type 2 diabetes mellitus with diabetic neuropathy, unspecified: Secondary | ICD-10-CM | POA: Diagnosis not present

## 2022-02-04 DIAGNOSIS — H43813 Vitreous degeneration, bilateral: Secondary | ICD-10-CM | POA: Diagnosis not present

## 2022-02-25 ENCOUNTER — Other Ambulatory Visit: Payer: Self-pay | Admitting: Internal Medicine

## 2022-02-26 NOTE — Telephone Encounter (Signed)
Pt is requesting a refill on triamcinolone cream, would Dr. Harrington Challenger like to refill this medication? Please address

## 2022-03-03 NOTE — Telephone Encounter (Signed)
Left a message for the pt to call back... this was given last Spring for a rash from his monitor.   Refill is not appropriate.

## 2022-03-03 NOTE — Telephone Encounter (Signed)
Patient should have PCP fill

## 2022-04-04 ENCOUNTER — Other Ambulatory Visit: Payer: Self-pay | Admitting: Internal Medicine

## 2022-04-06 NOTE — Telephone Encounter (Signed)
I called with the Pharmacist [facility] re: the several requests for his triamcinolone Cr that we gave him several months back... it has been denied several times... [facility] it is no longer on auto refill.

## 2022-04-22 ENCOUNTER — Other Ambulatory Visit: Payer: Self-pay | Admitting: Internal Medicine

## 2022-05-04 ENCOUNTER — Encounter: Payer: Self-pay | Admitting: *Deleted

## 2022-05-04 ENCOUNTER — Telehealth: Payer: Self-pay | Admitting: *Deleted

## 2022-05-04 NOTE — Patient Outreach (Signed)
  Care Coordination   Initial Visit Note   05/04/2022 Name: Antonio Andrade MRN: [id number] DOB: November 27, 1950  Antonio Andrade is a 72 y.o. year old male who sees London Pepper, MD for primary care. I spoke with  Antonio Andrade by phone today.  What matters to the patients health and wellness today?  House cleaning, transportation and mail order delivery on medications    Goals Addressed               This Visit's Progress     COMPLETED: Medication assistanc/house cleaning/transportation (pt-stated)        Care Coordination Interventions: Reviewed medications with patient and discussed adherence with  no needed refills Reviewed scheduled/upcoming provider appointments including sufficient transportation source Social Work referral for house cleaning and transportation Leisure centre manager) Pharmacy referral for mail order deliver on medications ([facility] referral) Screening for signs and symptoms of depression related to chronic disease state  Assessed social determinant of health barriers Educated on care management services related to social workers, pharmacy and Designer, jewellery. No other needs presented today for ongoing follow up.          SDOH assessments and interventions completed:  Yes  SDOH Interventions Today    Flowsheet Row Most Recent Value  SDOH Interventions   Food Insecurity Interventions Intervention Not Indicated  Housing Interventions Intervention Not Indicated  Transportation Interventions Intervention Not Indicated  Utilities Interventions Intervention Not Indicated        Care Coordination Interventions:  Yes, provided   Follow up plan: No further intervention required.   Encounter Outcome:  Pt. Visit Completed   Raina Mina, RN Care Management Coordinator [place] Office [phone]

## 2022-05-04 NOTE — Patient Instructions (Signed)
Visit Information  Thank you for taking time to visit with me today. Please don't hesitate to contact me if I can be of assistance to you.   Following are the goals we discussed today:   Goals Addressed               This Visit's Progress     COMPLETED: Medication assistanc/house cleaning/transportation (pt-stated)        Care Coordination Interventions: Reviewed medications with patient and discussed adherence with  no needed refills Reviewed scheduled/upcoming provider appointments including sufficient transportation source Social Work referral for house cleaning and transportation Leisure centre manager) Pharmacy referral for mail order deliver on medications ([facility] referral) Screening for signs and symptoms of depression related to chronic disease state  Assessed social determinant of health barriers Educated on care management services related to social workers, pharmacy and Designer, jewellery. No other needs presented today for ongoing follow up.          Please call the care guide team at [phone] if you need to cancel or reschedule your appointment.   If you are experiencing a Mental Health or [place] or need someone to talk to, please call the Suicide and Crisis Lifeline: 988  Patient verbalizes understanding of instructions and care plan provided today and agrees to view in [place]. Active MyChart status and patient understanding of how to access instructions and care plan via MyChart confirmed with patient.     No further follow up required: at this time.  Raina Mina, RN Care Management Coordinator [place] Office [phone]

## 2022-05-07 ENCOUNTER — Telehealth: Payer: Self-pay

## 2022-05-07 NOTE — Patient Outreach (Signed)
  Care Coordination   05/07/2022 Name: Antonio Andrade MRN: [id number] DOB: May 27, 1950   Care Coordination Outreach Attempts:  An unsuccessful telephone outreach was attempted today to offer the patient information about available care coordination services as a benefit of their health plan.   Follow Up Plan:  Additional outreach attempts will be made to offer the patient care coordination information and services.   Encounter Outcome:  No Answer   Care Coordination Interventions:  No, not indicated    Daneen Schick, BSW, CDP Social Worker, Certified Dementia Practitioner [facility]  Care Coordination [phone]

## 2022-05-12 ENCOUNTER — Telehealth: Payer: Self-pay

## 2022-05-12 NOTE — Patient Outreach (Signed)
  Care Coordination   05/12/2022 Name: Antonio Andrade MRN: [id number] DOB: Oct 13, 1950   Care Coordination Outreach Attempts:  A second unsuccessful outreach was attempted today to offer the patient with information about available care coordination services as a benefit of their health plan.     Follow Up Plan:  Additional outreach attempts will be made to offer the patient care coordination information and services.   Encounter Outcome:  No Answer   Care Coordination Interventions:  No, not indicated    Daneen Schick, BSW, CDP Social Worker, Certified Dementia Practitioner [facility]  Care Coordination [phone]

## 2022-05-17 ENCOUNTER — Telehealth: Payer: Self-pay

## 2022-05-17 NOTE — Patient Outreach (Signed)
  Care Coordination   05/17/2022 Name: Antonio Andrade MRN: [id number] DOB: 1951/01/29   Care Coordination Outreach Attempts:  A third unsuccessful outreach was attempted today to offer the patient with information about available care coordination services as a benefit of their health plan.   Follow Up Plan:  No further outreach attempts will be made at this time. We have been unable to contact the patient to offer or enroll patient in care coordination services  Encounter Outcome:  No Answer   Care Coordination Interventions:  No, not indicated    Daneen Schick, BSW, CDP Social Worker, Certified Dementia Practitioner [facility]  Care Coordination [phone]

## 2022-06-07 DIAGNOSIS — Z9181 History of falling: Secondary | ICD-10-CM | POA: Diagnosis not present

## 2022-06-07 DIAGNOSIS — Z8673 Personal history of transient ischemic attack (TIA), and cerebral infarction without residual deficits: Secondary | ICD-10-CM | POA: Diagnosis not present

## 2022-06-07 DIAGNOSIS — E1165 Type 2 diabetes mellitus with hyperglycemia: Secondary | ICD-10-CM | POA: Diagnosis not present

## 2022-06-07 DIAGNOSIS — R42 Dizziness and giddiness: Secondary | ICD-10-CM | POA: Diagnosis not present

## 2022-06-07 DIAGNOSIS — I4891 Unspecified atrial fibrillation: Secondary | ICD-10-CM | POA: Diagnosis not present

## 2022-06-07 DIAGNOSIS — E039 Hypothyroidism, unspecified: Secondary | ICD-10-CM | POA: Diagnosis not present

## 2022-06-07 DIAGNOSIS — D649 Anemia, unspecified: Secondary | ICD-10-CM | POA: Diagnosis not present

## 2022-06-07 DIAGNOSIS — Z0000 Encounter for general adult medical examination without abnormal findings: Secondary | ICD-10-CM | POA: Diagnosis not present

## 2022-06-07 DIAGNOSIS — R3912 Poor urinary stream: Secondary | ICD-10-CM | POA: Diagnosis not present

## 2022-06-07 DIAGNOSIS — J45909 Unspecified asthma, uncomplicated: Secondary | ICD-10-CM | POA: Diagnosis not present

## 2022-06-07 DIAGNOSIS — E538 Deficiency of other specified B group vitamins: Secondary | ICD-10-CM | POA: Diagnosis not present

## 2022-06-07 DIAGNOSIS — E1169 Type 2 diabetes mellitus with other specified complication: Secondary | ICD-10-CM | POA: Diagnosis not present

## 2022-06-07 DIAGNOSIS — E785 Hyperlipidemia, unspecified: Secondary | ICD-10-CM | POA: Diagnosis not present

## 2022-06-17 ENCOUNTER — Other Ambulatory Visit: Payer: Self-pay | Admitting: Internal Medicine

## 2022-06-17 NOTE — Telephone Encounter (Signed)
Pt's pharmacy is requesting a refill on triamcinolone cream. Would Dr. Harrington Challenger like to refill this medication cream? Please address

## 2022-09-06 DIAGNOSIS — R42 Dizziness and giddiness: Secondary | ICD-10-CM | POA: Diagnosis not present

## 2022-09-06 DIAGNOSIS — E039 Hypothyroidism, unspecified: Secondary | ICD-10-CM | POA: Diagnosis not present

## 2022-09-06 DIAGNOSIS — E1169 Type 2 diabetes mellitus with other specified complication: Secondary | ICD-10-CM | POA: Diagnosis not present

## 2022-09-06 DIAGNOSIS — E785 Hyperlipidemia, unspecified: Secondary | ICD-10-CM | POA: Diagnosis not present

## 2022-09-06 DIAGNOSIS — D649 Anemia, unspecified: Secondary | ICD-10-CM | POA: Diagnosis not present

## 2022-09-06 DIAGNOSIS — E538 Deficiency of other specified B group vitamins: Secondary | ICD-10-CM | POA: Diagnosis not present

## 2022-09-06 DIAGNOSIS — E119 Type 2 diabetes mellitus without complications: Secondary | ICD-10-CM | POA: Diagnosis not present

## 2022-09-18 ENCOUNTER — Other Ambulatory Visit: Payer: Self-pay | Admitting: Internal Medicine

## 2022-09-27 ENCOUNTER — Other Ambulatory Visit: Payer: Self-pay | Admitting: Internal Medicine

## 2022-10-27 ENCOUNTER — Other Ambulatory Visit: Payer: Self-pay | Admitting: Internal Medicine

## 2022-11-27 ENCOUNTER — Other Ambulatory Visit: Payer: Self-pay | Admitting: Internal Medicine

## 2022-12-06 DIAGNOSIS — E119 Type 2 diabetes mellitus without complications: Secondary | ICD-10-CM | POA: Diagnosis not present

## 2022-12-06 DIAGNOSIS — M2550 Pain in unspecified joint: Secondary | ICD-10-CM | POA: Diagnosis not present

## 2022-12-06 DIAGNOSIS — E1169 Type 2 diabetes mellitus with other specified complication: Secondary | ICD-10-CM | POA: Diagnosis not present

## 2022-12-06 DIAGNOSIS — E538 Deficiency of other specified B group vitamins: Secondary | ICD-10-CM | POA: Diagnosis not present

## 2022-12-06 DIAGNOSIS — D649 Anemia, unspecified: Secondary | ICD-10-CM | POA: Diagnosis not present

## 2022-12-06 DIAGNOSIS — R42 Dizziness and giddiness: Secondary | ICD-10-CM | POA: Diagnosis not present

## 2022-12-13 ENCOUNTER — Other Ambulatory Visit: Payer: Self-pay | Admitting: Internal Medicine

## 2023-02-23 DIAGNOSIS — E1142 Type 2 diabetes mellitus with diabetic polyneuropathy: Secondary | ICD-10-CM | POA: Diagnosis not present

## 2023-02-23 DIAGNOSIS — G63 Polyneuropathy in diseases classified elsewhere: Secondary | ICD-10-CM | POA: Diagnosis not present

## 2023-03-09 DIAGNOSIS — E1139 Type 2 diabetes mellitus with other diabetic ophthalmic complication: Secondary | ICD-10-CM | POA: Diagnosis not present

## 2023-03-09 DIAGNOSIS — E785 Hyperlipidemia, unspecified: Secondary | ICD-10-CM | POA: Diagnosis not present

## 2023-03-09 DIAGNOSIS — D649 Anemia, unspecified: Secondary | ICD-10-CM | POA: Diagnosis not present

## 2023-03-09 DIAGNOSIS — R42 Dizziness and giddiness: Secondary | ICD-10-CM | POA: Diagnosis not present

## 2023-03-09 DIAGNOSIS — E119 Type 2 diabetes mellitus without complications: Secondary | ICD-10-CM | POA: Diagnosis not present

## 2023-03-09 DIAGNOSIS — E039 Hypothyroidism, unspecified: Secondary | ICD-10-CM | POA: Diagnosis not present

## 2023-03-09 DIAGNOSIS — E538 Deficiency of other specified B group vitamins: Secondary | ICD-10-CM | POA: Diagnosis not present

## 2023-03-09 DIAGNOSIS — Z23 Encounter for immunization: Secondary | ICD-10-CM | POA: Diagnosis not present

## 2023-03-28 IMAGING — MR MR HEAD WO/W CM
13 series · 48 of 48 positions shown · IV contrast (multihance)
Comparison: None

CLINICAL DATA: Vertebrobasilar insufficiency

EXAM:
MRI HEAD WITHOUT AND WITH CONTRAST
MRA HEAD WITHOUT CONTRAST
TECHNIQUE: Multiplanar, multi-echo pulse sequences of the brain and surrounding
structures were acquired without and with intravenous contrast.
Angiographic images of the Circle of Willis were acquired using MRA
technique without intravenous contrast.
CONTRAST:  19mL MULTIHANCE GADOBENATE DIMEGLUMINE 529 MG/ML IV SOLN

[Series 5: T1 · sagittal · 4.0mm · 0.75mm/px · 2 of 31 slices shown (1 of 3)]
[im 1/31]
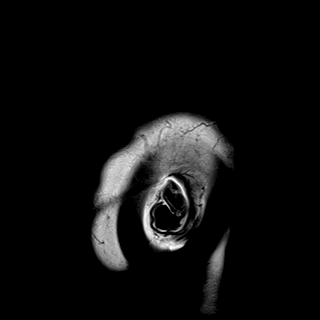
[im 31/31]
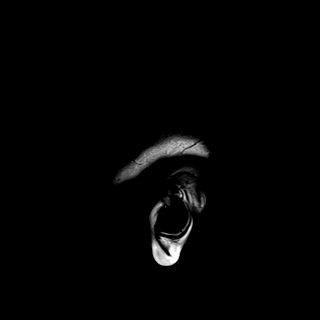

[Series 6: DWI · axial · 3.0mm · 0.94mm/px · z∈[-69,+69]mm · 8 of 160 slices shown (1 of 3)]
[im 1/160]
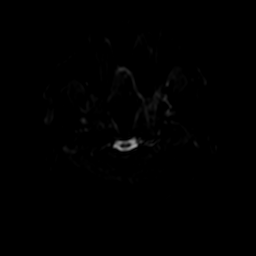
[im 23/160]
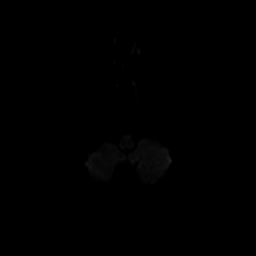
[im 46/160]
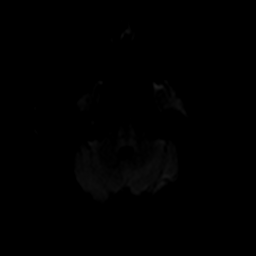
[im 69/160]
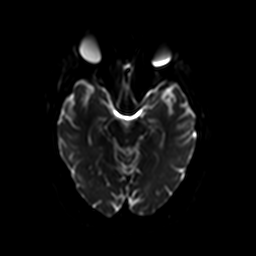
[im 91/160]
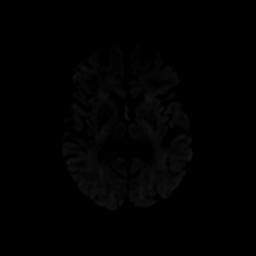
[im 114/160]
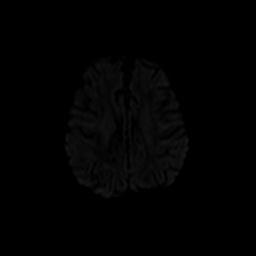
[im 137/160]
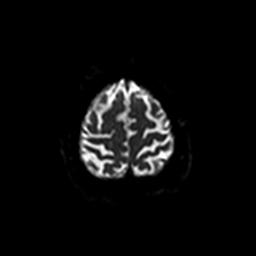
[im 160/160]
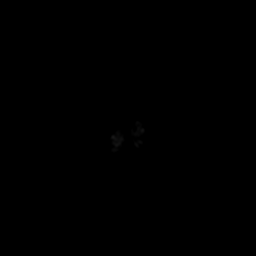

[Series 7: ax dwi_tracew · axial · 3.0mm · 0.94mm/px · z∈[-69,+69]mm · 4 of 80 slices shown]
[im 1/80]
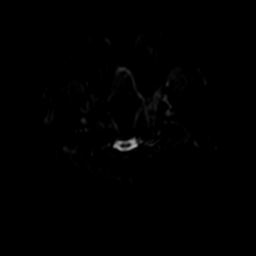
[im 27/80]
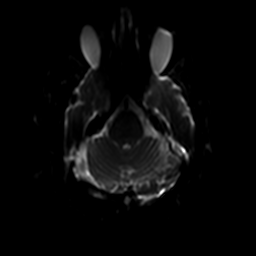
[im 53/80]
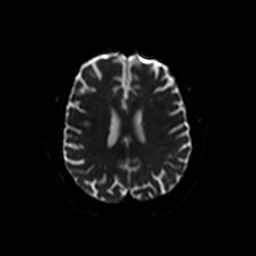
[im 80/80]
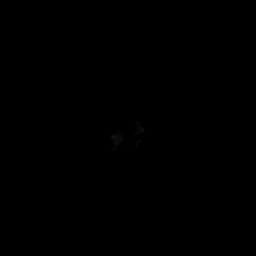

[Series 8: ax dwi_adc · axial · 3.0mm · 0.94mm/px · z∈[-69,+69]mm · 2 of 40 slices shown]
[im 1/40]
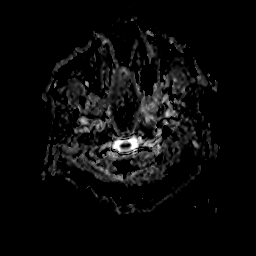
[im 40/40]
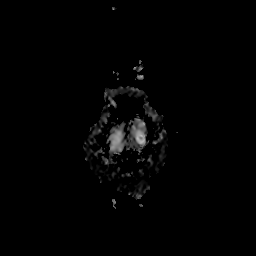

[Series 9: DWI · coronal · 5.0mm · 1.44mm/px · 3 of 60 slices shown (2 of 3)]
[im 1/60]
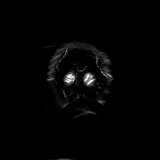
[im 30/60]
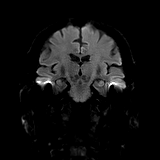
[im 60/60]
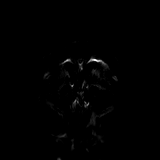

[Series 10: DWI · coronal · 5.0mm · 1.44mm/px · 2 of 30 slices shown (3 of 3)]
[im 1/30]
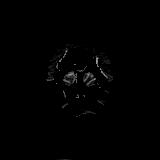
[im 30/30]
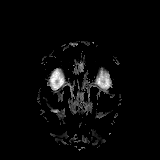

[Series 11: T2 · axial · 4.0mm · 0.36mm/px · 1 of 27 slices shown]
[im 1/27]
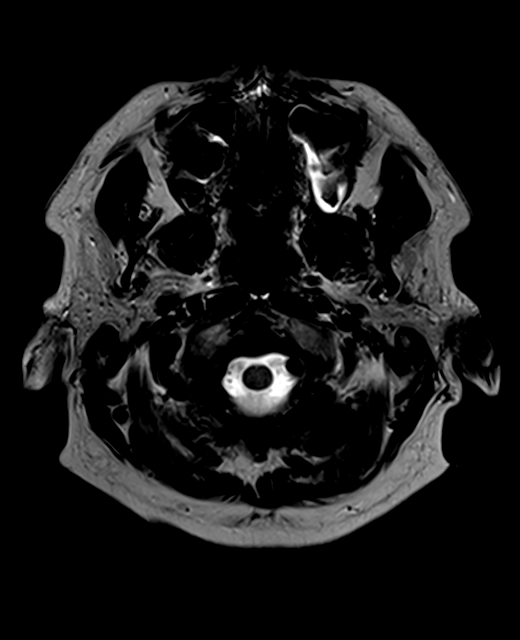

[Series 12: FLAIR · axial · 3.0mm · 0.72mm/px · 1 of 26 slices shown]
[im 1/26]
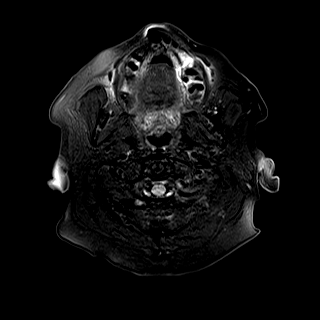

[Series 13: swi_images · axial · 1.5mm · 0.90mm/px · z∈[-67,+74]mm · 5 of 96 slices shown]
[im 1/96]
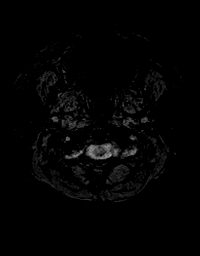
[im 24/96]
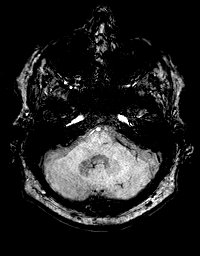
[im 48/96]
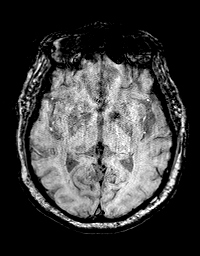
[im 72/96]
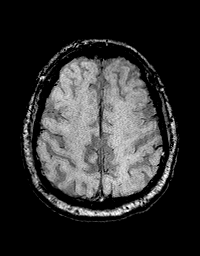
[im 96/96]
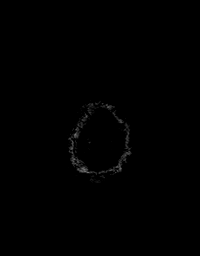

[Series 15: T1 · axial · 1.0mm · 0.94mm/px · z∈[-74,+83]mm · 8 of 160 slices shown (2 of 3)]
[im 1/160]
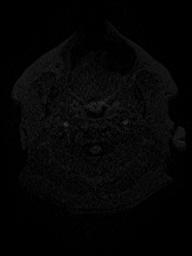
[im 23/160]
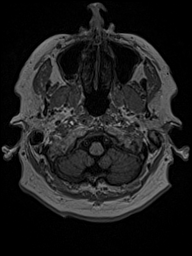
[im 46/160]
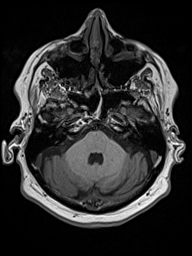
[im 69/160]
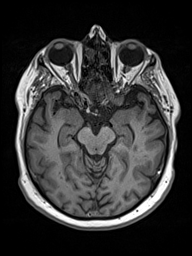
[im 91/160]
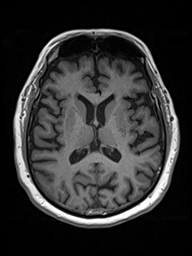
[im 114/160]
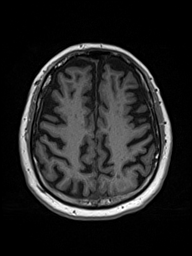
[im 137/160]
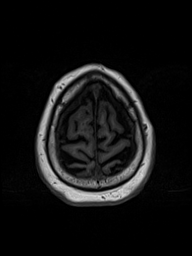
[im 160/160]
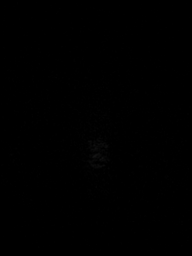

[Series 16: T2 post-contrast · coronal · 4.0mm · 0.36mm/px · 2 of 32 slices shown]
[im 1/32]
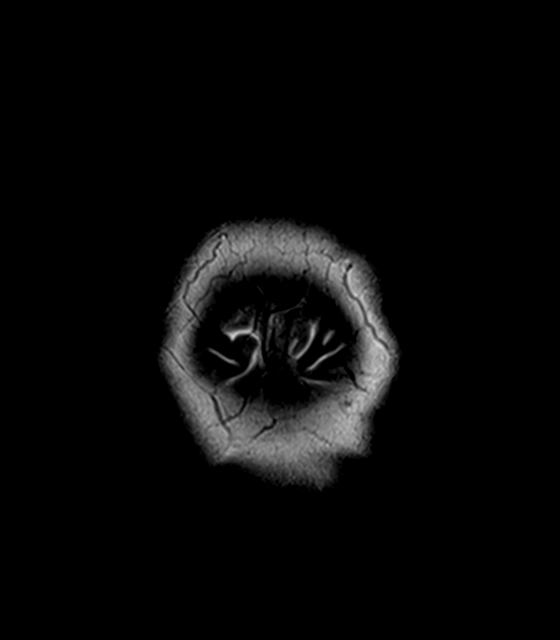
[im 32/32]
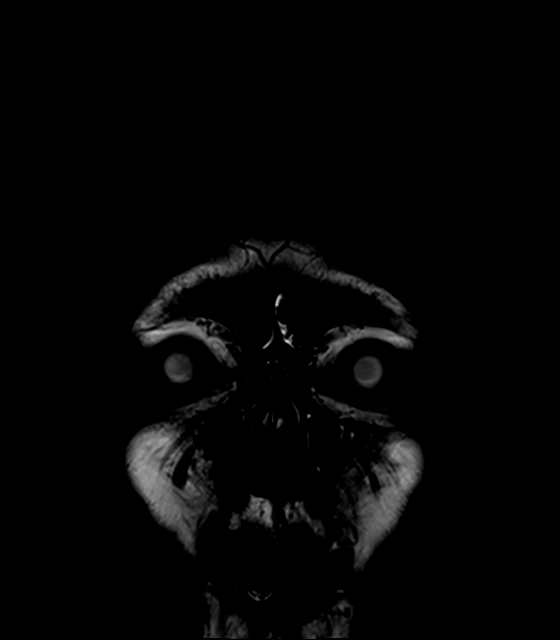

[Series 17: T1 · axial · 1.0mm · 0.94mm/px · z∈[-74,+83]mm · 8 of 160 slices shown (3 of 3)]
[im 1/160]
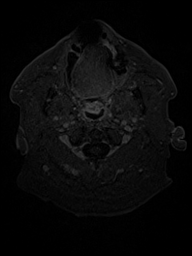
[im 23/160]
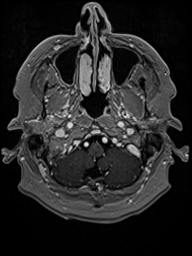
[im 46/160]
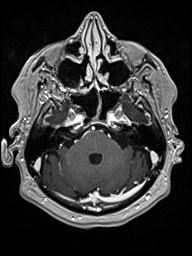
[im 69/160]
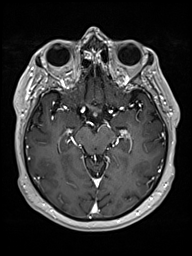
[im 91/160]
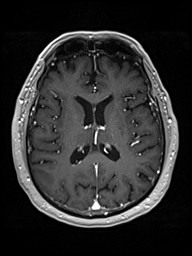
[im 114/160]
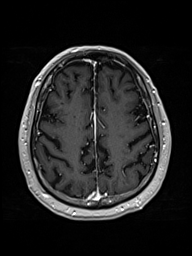
[im 137/160]
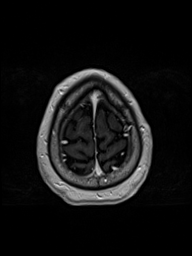
[im 160/160]
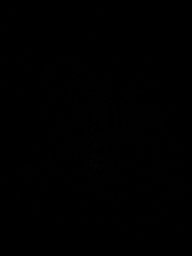

[Series 18: T1 post-contrast · coronal · 4.0mm · 0.72mm/px · 2 of 32 slices shown]
[im 1/32]
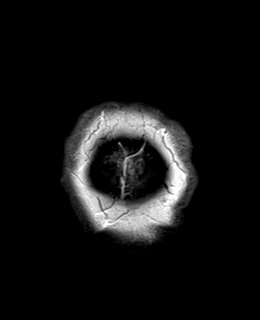
[im 32/32]
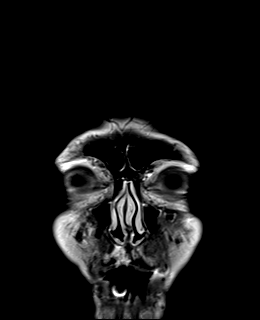

[48 of 48 positions shown; findings below may reference images not displayed]

FINDINGS: MRI HEAD

Brain: There is no acute infarction or intracranial hemorrhage.
There is no intracranial mass, mass effect, or edema. There is no
hydrocephalus or extra-axial fluid collection. Prominence of the
ventricles and sulci reflects mild generalized parenchymal volume
loss. Patchy foci of T2 hyperintensity in the supratentorial white
matter are nonspecific but may reflect mild chronic microvascular
ischemic changes.

Vascular: Major vessel flow voids at the skull base are preserved.

Skull and upper cervical spine: Normal marrow signal is preserved.

Sinuses/Orbits: Paranasal sinuses are aerated. Orbits are
unremarkable.

Other: Sella is unremarkable.  Mastoid air cells are clear.

MRA HEAD

Intracranial internal carotid arteries are patent. Middle and
anterior cerebral arteries are patent. Included intracranial
vertebral arteries are patent. Basilar artery is patent. Major
cerebellar artery origins are patent. Posterior cerebral arteries
are patent. There is no significant stenosis or aneurysm.
IMPRESSION: No evidence of recent infarction, hemorrhage, or mass. Mild chronic
microvascular ischemic changes.

Normal MRA of the head.

## 2023-04-01 ENCOUNTER — Other Ambulatory Visit: Payer: Self-pay | Admitting: Internal Medicine

## 2023-04-01 NOTE — Telephone Encounter (Signed)
Pt has had 3 attempts.Still no pending appt. Does Dr Tenny Craw want to continue to refill? Please advise

## 2023-04-04 MED ORDER — METOPROLOL SUCCINATE ER 25 MG PO TB24: 25.0000 mg | ORAL_TABLET | Freq: Every day | ORAL | 0 refills | Status: DC

## 2023-05-09 ENCOUNTER — Other Ambulatory Visit: Payer: Self-pay | Admitting: Internal Medicine

## 2023-05-17 DIAGNOSIS — M9901 Segmental and somatic dysfunction of cervical region: Secondary | ICD-10-CM | POA: Diagnosis not present

## 2023-05-17 DIAGNOSIS — M9903 Segmental and somatic dysfunction of lumbar region: Secondary | ICD-10-CM | POA: Diagnosis not present

## 2023-05-26 DIAGNOSIS — M79641 Pain in right hand: Secondary | ICD-10-CM | POA: Diagnosis not present

## 2023-05-26 DIAGNOSIS — M79642 Pain in left hand: Secondary | ICD-10-CM | POA: Diagnosis not present

## 2023-06-14 DIAGNOSIS — I4891 Unspecified atrial fibrillation: Secondary | ICD-10-CM | POA: Diagnosis not present

## 2023-06-14 DIAGNOSIS — Z0000 Encounter for general adult medical examination without abnormal findings: Secondary | ICD-10-CM | POA: Diagnosis not present

## 2023-06-14 DIAGNOSIS — E785 Hyperlipidemia, unspecified: Secondary | ICD-10-CM | POA: Diagnosis not present

## 2023-06-14 DIAGNOSIS — Z23 Encounter for immunization: Secondary | ICD-10-CM | POA: Diagnosis not present

## 2023-06-14 DIAGNOSIS — G4733 Obstructive sleep apnea (adult) (pediatric): Secondary | ICD-10-CM | POA: Diagnosis not present

## 2023-06-14 DIAGNOSIS — E1169 Type 2 diabetes mellitus with other specified complication: Secondary | ICD-10-CM | POA: Diagnosis not present

## 2023-06-14 DIAGNOSIS — D509 Iron deficiency anemia, unspecified: Secondary | ICD-10-CM | POA: Diagnosis not present

## 2023-06-14 DIAGNOSIS — Z8673 Personal history of transient ischemic attack (TIA), and cerebral infarction without residual deficits: Secondary | ICD-10-CM | POA: Diagnosis not present

## 2023-06-14 DIAGNOSIS — E538 Deficiency of other specified B group vitamins: Secondary | ICD-10-CM | POA: Diagnosis not present

## 2023-06-14 DIAGNOSIS — J45909 Unspecified asthma, uncomplicated: Secondary | ICD-10-CM | POA: Diagnosis not present

## 2023-06-14 DIAGNOSIS — R3912 Poor urinary stream: Secondary | ICD-10-CM | POA: Diagnosis not present

## 2023-06-14 DIAGNOSIS — I10 Essential (primary) hypertension: Secondary | ICD-10-CM | POA: Diagnosis not present

## 2023-06-14 DIAGNOSIS — E039 Hypothyroidism, unspecified: Secondary | ICD-10-CM | POA: Diagnosis not present
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
## 2023-08-23 ENCOUNTER — Ambulatory Visit: Admitting: Family Medicine

## 2023-08-23 ENCOUNTER — Ambulatory Visit (INDEPENDENT_AMBULATORY_CARE_PROVIDER_SITE_OTHER)

## 2023-08-23 VITALS — BP 150/88 | HR 75 | Ht 70.0 in | Wt 261.0 lb

## 2023-08-23 DIAGNOSIS — M542 Cervicalgia: Secondary | ICD-10-CM | POA: Diagnosis not present

## 2023-08-23 DIAGNOSIS — S060X0A Concussion without loss of consciousness, initial encounter: Secondary | ICD-10-CM

## 2023-08-23 DIAGNOSIS — R42 Dizziness and giddiness: Secondary | ICD-10-CM | POA: Diagnosis not present

## 2023-08-23 DIAGNOSIS — R413 Other amnesia: Secondary | ICD-10-CM

## 2023-08-23 DIAGNOSIS — G8929 Other chronic pain: Secondary | ICD-10-CM | POA: Diagnosis not present

## 2023-08-23 DIAGNOSIS — M47812 Spondylosis without myelopathy or radiculopathy, cervical region: Secondary | ICD-10-CM | POA: Diagnosis not present

## 2023-08-23 NOTE — Patient Instructions (Addendum)
 Thank you for coming in today.   Please get an Xray today before you leave   I've referred you to Physical [facility] Speech Therapy.  Let us  know if you don't hear from them in one week.   Check back in 1 month

## 2023-08-24 NOTE — Progress Notes (Signed)
 X-ray images cervical spine shows medium arthritis at the base of the neck
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
## 2023-09-08 ENCOUNTER — Encounter: Payer: Self-pay | Admitting: Physical Therapy

## 2023-09-08 ENCOUNTER — Ambulatory Visit: Attending: Family Medicine | Admitting: Physical Therapy

## 2023-09-08 ENCOUNTER — Other Ambulatory Visit: Payer: Self-pay

## 2023-09-08 DIAGNOSIS — H8112 Benign paroxysmal vertigo, left ear: Secondary | ICD-10-CM | POA: Diagnosis not present

## 2023-09-08 DIAGNOSIS — M542 Cervicalgia: Secondary | ICD-10-CM | POA: Insufficient documentation

## 2023-09-08 DIAGNOSIS — R2681 Unsteadiness on feet: Secondary | ICD-10-CM | POA: Insufficient documentation

## 2023-09-08 DIAGNOSIS — R42 Dizziness and giddiness: Secondary | ICD-10-CM | POA: Insufficient documentation

## 2023-09-19 DIAGNOSIS — E785 Hyperlipidemia, unspecified: Secondary | ICD-10-CM | POA: Diagnosis not present

## 2023-09-19 DIAGNOSIS — E039 Hypothyroidism, unspecified: Secondary | ICD-10-CM | POA: Diagnosis not present

## 2023-09-19 DIAGNOSIS — I10 Essential (primary) hypertension: Secondary | ICD-10-CM | POA: Diagnosis not present

## 2023-09-19 DIAGNOSIS — E1139 Type 2 diabetes mellitus with other diabetic ophthalmic complication: Secondary | ICD-10-CM | POA: Diagnosis not present

## 2023-09-19 DIAGNOSIS — R42 Dizziness and giddiness: Secondary | ICD-10-CM | POA: Diagnosis not present

## 2023-09-19 DIAGNOSIS — E538 Deficiency of other specified B group vitamins: Secondary | ICD-10-CM | POA: Diagnosis not present

## 2023-09-19 DIAGNOSIS — D649 Anemia, unspecified: Secondary | ICD-10-CM | POA: Diagnosis not present

## 2023-09-19 NOTE — Therapy (Signed)
 OUTPATIENT PHYSICAL THERAPY VESTIBULAR TREATMENT   Patient Name: Antonio Andrade MRN: [id number] DOB:13-Dec-1950, 73 y.o., male Today's Date: 09/20/2023   PCP: Ronna Coho, MD  REFERRING PROVIDER: Syliva Even, MD  END OF SESSION:  PT End of Session - 09/20/23 0848     Visit Number 2    Number of Visits 17    Date for PT Re-Evaluation 11/03/23    Authorization Type [employer] Medicare    Authorization Time Period approved 17 visits from 09/08/2023-11/03/2023    Authorization - Visit Number 2    Authorization - Number of Visits 17    PT Start Time 0849    PT Stop Time 0930    PT Time Calculation (min) 41 min    Activity Tolerance Patient tolerated treatment well    Behavior During Therapy [facility] for tasks assessed/performed              Past Medical History:  Diagnosis Date   Acid reflux    Asthma    Atrial fibrillation (HCC)    Bipolar 1 disorder (HCC)    BPH (benign prostatic hyperplasia)    C. difficile colitis    Depression    Diabetes mellitus without complication (HCC)    Fatty liver    Fibromyalgia    Hyperlipidemia    Hypertension    Hypertensive retinopathy    Hypothyroidism    Insomnia    Low vitamin B12 level    Neuropathy    Obesity    Positive FIT (fecal immunochemical test)    Past Surgical History:  Procedure Laterality Date   CHOLECYSTECTOMY     Patient Active Problem List   Diagnosis Date Noted   Obesity 03/09/2018   Cellulitis 03/07/2018   Type 2 diabetes mellitus, controlled (HCC) 03/03/2018   Chronic diastolic CHF (congestive heart failure) (HCC) 03/03/2018   Cellulitis of left lower leg 03/03/2018   Weight gain 03/02/2018   Type 2 diabetes mellitus (HCC) 03/02/2018   Diabetic neuropathy (HCC) 03/02/2018   Hypothyroidism 03/02/2018   Chronic anemia 03/02/2018   HTN (hypertension) 03/02/2018   Depression 03/02/2018   Mood disorder (HCC) 03/02/2018   HLD (hyperlipidemia) 03/02/2018   GERD (gastroesophageal reflux disease) 03/02/2018    Asthma 03/02/2018   Muscle tension dysphonia 03/26/2013   Nasal polyp 03/26/2013   Vocal cord atrophy 03/26/2013    ONSET DATE: 07/09/23  REFERRING DIAG: M54.2 (ICD-10-CM) - Neck pain  THERAPY DIAG:  Dizziness and giddiness  BPPV (benign paroxysmal positional vertigo), left  Unsteadiness on feet  Cervicalgia  Rationale for Evaluation and Treatment: Rehabilitation  SUBJECTIVE:  SUBJECTIVE STATEMENT: Drove to [facility] Thursday in the rain at night- was so tense that he had body aches. Other than that I'm okay. Denies any more episodes of "blacking out" since last session.     Pt accompanied by: self  PERTINENT HISTORY: Asthma, a-fib, bipolar disorder, depression, DM, fibromyalgia, HLD, HTN, neuropathy   PAIN:  Are you having pain? Yes: NPRS scale: 3/10 Pain location: neck & shoulders, fingers (always a 9-10) Pain description: aching, shooting, tingling, sharp Aggravating factors: pt unsure Relieving factors: chiropractor   PRECAUTIONS: Fall  RED FLAGS: None   WEIGHT BEARING RESTRICTIONS: No  FALLS: Has patient fallen in last 6 months? Yes. Number of falls 10  LIVING ENVIRONMENT: Lives with: lives alone Lives in: House/apartment Stairs: no Has following equipment at home: Single point cane, Environmental consultant - 4 wheeled, Tour manager, Grab bars, and None  PLOF: Independent and Vocation/Vocational requirements: retired; pt sits to do dishes and doesn't vacuum as much (reports this was the case at [facility] but he is more fatigued now)  PATIENT GOALS: improve dizziness  OBJECTIVE:      TODAY'S TREATMENT: 09/20/23 Activity Comments  L DH negative  R DH Negative; c/o dizziness upon sitting up  R sidelying test  negative  L sidelying test   Negative; pt reports slight lightheadedness upon  lying down   R roll test Negative; c/o dizziness which dissipated   L roll test Negative; c/o dizziness which dissipated   Palpation  Pt very TTP and tight in most medial UT, LS, rhomboids, and significantly through cervical paraspinals and suboccipitals   Cervical extension SNAG Cueing for form and avoiding pushing into pain      CERVICAL ROM:   Active ROM AROM (deg) eval  Flexion 30  Extension 17 *pain  Right lateral flexion 16 *pain  Left lateral flexion 11 *pain  Right rotation 29 *pain  Left rotation 32 *pain    (Blank rows = not tested)    HOME EXERCISE PROGRAM Last updated: 09/20/23 Access Code: [facility] URL: [email or web address] Date: 09/20/2023 Prepared by: [facility] - Outpatient  Rehab - [facility]  Program Notes do not push into intense pain!  Exercises - Brandt-Daroff Vestibular Exercise  - 1 x daily - 5 x weekly - 2 sets - 3-5 reps - Right Sidelying to Left Sidelying Vestibular Habituation  - 1 x daily - 5 x weekly - 2 sets - 3-5 reps - Supine to Right Sidelying Vestibular Habituation  - 1 x daily - 5 x weekly - 2 sets - 3-5 reps - Seated Cervical Rotation AROM  - 1 x daily - 5 x weekly - 2 sets - 10 reps - Seated Cervical Flexion AROM  - 1 x daily - 5 x weekly - 2 sets - 10 reps - Mid-Lower Cervical Extension SNAG with Strap  - 1 x daily - 5 x weekly - 2 sets - 10 reps  PATIENT EDUCATION: Education details: answered pt's questions on OT- he requests referral d/t difficulty with hand dexterity; edu on habituation to address dizziness; HEP with edu for safety and avoiding pain, edu on massage and heat to assist with pain levels  Person educated: Patient Education method: Explanation, Demonstration, Tactile cues, Verbal cues, and Handouts Education comprehension: verbalized understanding and returned demonstration      Note: Objective measures were completed at Evaluation unless otherwise noted.  DIAGNOSTIC FINDINGS: 08/23/23 cervical  xray: mild-to-moderate degenerative joint changes of the mid to lower cervical spine  COGNITION: Overall cognitive status: Within  functional limits for tasks assessed   SENSATION: Reports pain and numbness in feet and hands d/t neuropathy   COORDINATION: Alternating pronation/supination: WNL Alternating toe tap: WNL Finger to nose: slightly slowed R   POSTURE: rounded shoulders, forward head, and increased thoracic kyphosis  GAIT: Findings: Comments: slow, flexed trunk   PATIENT SURVEYS:  DHI 76/100   VESTIBULAR ASSESSMENT   GENERAL OBSERVATION: pt wears progressives   OCULOMOTOR EXAM: Ocular Alignment: Cover/Uncover Test: WNL Cover/Cross Cover Test: WNL   Ocular ROM: limited R eye inferior ROM   Spontaneous Nystagmus: absent   Gaze-Induced Nystagmus: absent   Smooth Pursuits: intact   Saccades: 2 saccades vertically, 1 saccade R (normal for age)   Convergence/Divergence: 9.5 cm    VESTIBULAR - OCULAR REFLEX:    Slow VOR: Normal and Comment: c/o dizziness and neck stiffness B planes   VOR Cancellation: Normal   Head-Impulse Test: HIT Right: negative HIT Left: positive     POSITIONAL TESTING:  Right Roll Test: negative Left Roll Test: negative  Right Loaded Dix-Hallpike: limited by neck pain; R upbeating torsional nystagmus but pt asymptomatic Left Loaded Dix-Hallpike: NT                                                                                                                               TREATMENT DATE: 09/08/23    PATIENT EDUCATION: Education details: exam findings, prognosis, POC, edu on BPPV and plan for CRM next session Person educated: Patient Education method: Medical illustrator Education comprehension: verbalized understanding  HOME EXERCISE PROGRAM: Not initiated  GOALS: Goals reviewed with patient? Yes  SHORT TERM GOALS: Target date: 10/06/2023  Patient to be independent with initial HEP. Baseline: HEP initiated Goal  status: IN PROGRESS    LONG TERM GOALS: Target date: 11/03/2023  Patient to be independent with advanced HEP. Baseline: Not yet initiated  Goal status: IN PROGRESS  Patient to report 0/10 dizziness with standing vertical and horizontal VOR for 30 seconds. Baseline: Unable Goal status: IN PROGRESS  Patient will report 0/10 dizziness with bed mobility.  Baseline: Symptomatic  Goal status: IN PROGRESS  Patient to demonstrate cervical AROM WFL and without pain limiting.  Baseline: see above Goal status: IN PROGRESS  Patient to score at least x/24 on DGI in order to decrease risk of falls. Baseline: NT Goal status: IN PROGRESS  Patient to report no falls in the past 6 weeks.  Baseline: 7 falls since concussion  Goal status: IN PROGRESS  Patient to score at least 18 points less on DHI in order to meet MCID and improve functional outcomes.  Baseline: 76 Goal status: IN PROGRESS    ASSESSMENT:  CLINICAL IMPRESSION:  Patient arrived to session without new complaints. Positional testing was negative today, however pt demonstrates remaining motion sensitivity. Cervical AROM is painful and limited; initiated gentle stretching within tolerance. HEP initiated with exercises that were well-tolerated today. Patient reported understanding and without complaints at end of  session.   OBJECTIVE IMPAIRMENTS: Abnormal gait, decreased activity tolerance, decreased balance, decreased coordination, decreased ROM, dizziness, impaired flexibility, postural dysfunction, and pain.   ACTIVITY LIMITATIONS: carrying, lifting, bending, sitting, standing, squatting, sleeping, stairs, transfers, bed mobility, bathing, toileting, dressing, reach over head, hygiene/grooming, locomotion level, and caring for others  PARTICIPATION LIMITATIONS: meal prep, cleaning, laundry, driving, shopping, community activity, and church  PERSONAL FACTORS: Age, Past/current experiences, Time since onset of  injury/illness/exacerbation, and 3+ comorbidities: Asthma, a-fib, bipolar disorder, depression, DM, fibromyalgia, HLD, HTN, neuropathy  are also affecting patient's functional outcome.   REHAB POTENTIAL: Good  CLINICAL DECISION MAKING: Evolving/moderate complexity  EVALUATION COMPLEXITY: Moderate  PLAN:  PT FREQUENCY: 2x/week  PT DURATION: 8 weeks  PLANNED INTERVENTIONS: 97164- PT Re-evaluation, 97750- Physical Performance Testing, 97110-Therapeutic exercises, 97530- Therapeutic activity, 97112- Neuromuscular re-education, 97535- Self Care, 16109- Manual therapy, [phone]- Gait training, [phone]- Canalith repositioning, [id number]- Aquatic Therapy, [id number]- Electrical stimulation (unattended), Patient/Family education, Balance training, Stair training, Taping, Dry Needling, Joint mobilization, Spinal mobilization, Vestibular training, Cryotherapy, and Moist heat  PLAN FOR NEXT SESSION: DGI and make goal; motion sensitivity and cervical ROM   Thaddeus Filippo, PT, DPT 09/20/23 9:31 AM  [facility] Rehab at [facility] [address] [place], [place] [address] Phone # [phone] Fax # [phone]

## 2023-09-20 ENCOUNTER — Other Ambulatory Visit: Payer: Self-pay

## 2023-09-20 ENCOUNTER — Ambulatory Visit: Attending: Family Medicine

## 2023-09-20 ENCOUNTER — Ambulatory Visit: Admitting: Physical Therapy

## 2023-09-20 ENCOUNTER — Encounter: Payer: Self-pay | Admitting: Physical Therapy

## 2023-09-20 ENCOUNTER — Telehealth: Payer: Self-pay | Admitting: Physical Therapy

## 2023-09-20 DIAGNOSIS — R413 Other amnesia: Secondary | ICD-10-CM | POA: Diagnosis not present

## 2023-09-20 DIAGNOSIS — R42 Dizziness and giddiness: Secondary | ICD-10-CM

## 2023-09-20 DIAGNOSIS — R2681 Unsteadiness on feet: Secondary | ICD-10-CM | POA: Insufficient documentation

## 2023-09-20 DIAGNOSIS — M542 Cervicalgia: Secondary | ICD-10-CM | POA: Insufficient documentation

## 2023-09-20 DIAGNOSIS — H8112 Benign paroxysmal vertigo, left ear: Secondary | ICD-10-CM

## 2023-09-20 DIAGNOSIS — R41841 Cognitive communication deficit: Secondary | ICD-10-CM | POA: Insufficient documentation
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
## 2023-09-20 NOTE — Patient Instructions (Signed)

## 2023-09-22 ENCOUNTER — Ambulatory Visit

## 2023-09-22 DIAGNOSIS — R42 Dizziness and giddiness: Secondary | ICD-10-CM | POA: Diagnosis not present

## 2023-09-22 DIAGNOSIS — M542 Cervicalgia: Secondary | ICD-10-CM | POA: Diagnosis not present

## 2023-09-22 DIAGNOSIS — H8112 Benign paroxysmal vertigo, left ear: Secondary | ICD-10-CM | POA: Diagnosis not present

## 2023-09-22 DIAGNOSIS — R41841 Cognitive communication deficit: Secondary | ICD-10-CM | POA: Diagnosis not present

## 2023-09-22 DIAGNOSIS — R2681 Unsteadiness on feet: Secondary | ICD-10-CM | POA: Diagnosis not present

## 2023-09-22 DIAGNOSIS — R413 Other amnesia: Secondary | ICD-10-CM | POA: Diagnosis not present

## 2023-09-22 NOTE — Therapy (Signed)
 OUTPATIENT PHYSICAL THERAPY VESTIBULAR TREATMENT   Patient Name: Khalen Styer MRN: [id number] DOB:[birth year], 73 y.o., male Today's Date: 09/22/2023   PCP: Ronna Coho, MD  REFERRING PROVIDER: Syliva Even, MD  END OF SESSION:  PT End of Session - 09/22/23 0935     Visit Number 3    Number of Visits 17    Date for PT Re-Evaluation 11/03/23    Authorization Type [employer] Medicare    Authorization Time Period approved 17 visits from 09/08/2023-11/03/2023    Authorization - Visit Number 3    Authorization - Number of Visits 17    PT Start Time 0935    PT Stop Time 1015    PT Time Calculation (min) 40 min    Activity Tolerance Patient tolerated treatment well    Behavior During Therapy WFL for tasks assessed/performed              Past Medical History:  Diagnosis Date   Acid reflux    Asthma    Atrial fibrillation (HCC)    Bipolar 1 disorder (HCC)    BPH (benign prostatic hyperplasia)    C. difficile colitis    Depression    Diabetes mellitus without complication (HCC)    Fatty liver    Fibromyalgia    Hyperlipidemia    Hypertension    Hypertensive retinopathy    Hypothyroidism    Insomnia    Low vitamin B12 level    Neuropathy    Obesity    Positive FIT (fecal immunochemical test)    Past Surgical History:  Procedure Laterality Date   CHOLECYSTECTOMY     Patient Active Problem List   Diagnosis Date Noted   Obesity 03/09/2018   Cellulitis 03/07/2018   Type 2 diabetes mellitus, controlled (HCC) 03/03/2018   Chronic diastolic CHF (congestive heart failure) (HCC) 03/03/2018   Cellulitis of left lower leg 03/03/2018   Weight gain 03/02/2018   Type 2 diabetes mellitus (HCC) 03/02/2018   Diabetic neuropathy (HCC) 03/02/2018   Hypothyroidism 03/02/2018   Chronic anemia 03/02/2018   HTN (hypertension) 03/02/2018   Depression 03/02/2018   Mood disorder (HCC) 03/02/2018   HLD (hyperlipidemia) 03/02/2018   GERD (gastroesophageal reflux disease) 03/02/2018    Asthma 03/02/2018   Muscle tension dysphonia 03/26/2013   Nasal polyp 03/26/2013   Vocal cord atrophy 03/26/2013    ONSET DATE: 07/09/23  REFERRING DIAG: M54.2 (ICD-10-CM) - Neck pain  THERAPY DIAG:  Dizziness and giddiness  BPPV (benign paroxysmal positional vertigo), left  Unsteadiness on feet  Cervicalgia  Rationale for Evaluation and Treatment: Rehabilitation  SUBJECTIVE:  SUBJECTIVE STATEMENT: Feeling about the same    Pt accompanied by: self  PERTINENT HISTORY: Asthma, a-fib, bipolar disorder, depression, DM, fibromyalgia, HLD, HTN, neuropathy   PAIN:  Are you having pain? Yes: NPRS scale: 3/10 Pain location: neck & shoulders, fingers (always a 9-10) Pain description: aching, shooting, tingling, sharp Aggravating factors: pt unsure Relieving factors: chiropractor   PRECAUTIONS: Fall  RED FLAGS: None   WEIGHT BEARING RESTRICTIONS: No  FALLS: Has patient fallen in last 6 months? Yes. Number of falls 10  LIVING ENVIRONMENT: Lives with: lives alone Lives in: House/apartment Stairs: no Has following equipment at home: Single point cane, Environmental consultant - 4 wheeled, Tour manager, Grab bars, and None  PLOF: Independent and Vocation/Vocational requirements: retired; pt sits to do dishes and doesn't vacuum as much (reports this was the case at [facility] but he is more fatigued now)  PATIENT GOALS: improve dizziness  OBJECTIVE:    TODAY'S TREATMENT: 09/22/23 Activity Comments  Gait speed : 24 sec = 1.4 ft/sec   DGI 10/24  HEP review -brandt-daroff--more symptomatic w/ arising-no nystagmus noted -rolling R/L-slight dizzy-no nystagmus -cervical ROM and extension SNAG  Standing balance at counter Feet together EO/EC 2x15 sec Semi-tandem 2x15 sec              [facility] PT Assessment -  09/22/23 0001       Standardized Balance Assessment   Standardized Balance Assessment Dynamic Gait Index      Dynamic Gait Index   Level Surface Mild Impairment    Change in Gait Speed Moderate Impairment    Gait with Horizontal Head Turns Mild Impairment    Gait with Vertical Head Turns Moderate Impairment    Gait and Pivot Turn Moderate Impairment    Step Over Obstacle Moderate Impairment    Step Around Obstacles Moderate Impairment    Steps Moderate Impairment    Total Score 10              TODAY'S TREATMENT: 09/20/23 Activity Comments  L DH negative  R DH Negative; c/o dizziness upon sitting up  R sidelying test  negative  L sidelying test   Negative; pt reports slight lightheadedness upon lying down   R roll test Negative; c/o dizziness which dissipated   L roll test Negative; c/o dizziness which dissipated   Palpation  Pt very TTP and tight in most medial UT, LS, rhomboids, and significantly through cervical paraspinals and suboccipitals   Cervical extension SNAG Cueing for form and avoiding pushing into pain      CERVICAL ROM:   Active ROM AROM (deg) eval  Flexion 30  Extension 17 *pain  Right lateral flexion 16 *pain  Left lateral flexion 11 *pain  Right rotation 29 *pain  Left rotation 32 *pain    (Blank rows = not tested)    HOME EXERCISE PROGRAM Last updated: 09/20/23 Access Code: [facility] URL: [email or web address] Date: 09/20/2023 Prepared by: [facility] - Outpatient  Rehab - [facility]  Program Notes do not push into intense pain!  Exercises - Brandt-Daroff Vestibular Exercise  - 1 x daily - 5 x weekly - 2 sets - 3-5 reps - Right Sidelying to Left Sidelying Vestibular Habituation  - 1 x daily - 5 x weekly - 2 sets - 3-5 reps - Supine to Right Sidelying Vestibular Habituation  - 1 x daily - 5 x weekly - 2 sets - 3-5 reps - Seated Cervical Rotation AROM  - 1 x daily - 5 x weekly - 2 sets -  10 reps - Seated Cervical Flexion  AROM  - 1 x daily - 5 x weekly - 2 sets - 10 reps - Mid-Lower Cervical Extension SNAG with Strap  - 1 x daily - 5 x weekly - 2 sets - 10 reps - Feet Together Balance at [employer] Eyes Closed  - 1 x daily - 7 x weekly - 3 sets - 15 sec hold - Semi-Tandem Balance at [employer] Eyes Open  - 1 x daily - 7 x weekly - 3 sets - 15 sec hold  PATIENT EDUCATION: Education details: answered pt's questions on OT- he requests referral d/t difficulty with hand dexterity; edu on habituation to address dizziness; HEP with edu for safety and avoiding pain, edu on massage and heat to assist with pain levels  Person educated: Patient Education method: Explanation, Demonstration, Tactile cues, Verbal cues, and Handouts Education comprehension: verbalized understanding and returned demonstration      Note: Objective measures were completed at Evaluation unless otherwise noted.  DIAGNOSTIC FINDINGS: 08/23/23 cervical xray: mild-to-moderate degenerative joint changes of the mid to lower cervical spine  COGNITION: Overall cognitive status: Within functional limits for tasks assessed   SENSATION: Reports pain and numbness in feet and hands d/t neuropathy   COORDINATION: Alternating pronation/supination: WNL Alternating toe tap: WNL Finger to nose: slightly slowed R   POSTURE: rounded shoulders, forward head, and increased thoracic kyphosis  GAIT: Findings: Comments: slow, flexed trunk   PATIENT SURVEYS:  DHI 76/100   VESTIBULAR ASSESSMENT   GENERAL OBSERVATION: pt wears progressives   OCULOMOTOR EXAM: Ocular Alignment: Cover/Uncover Test: WNL Cover/Cross Cover Test: WNL   Ocular ROM: limited R eye inferior ROM   Spontaneous Nystagmus: absent   Gaze-Induced Nystagmus: absent   Smooth Pursuits: intact   Saccades: 2 saccades vertically, 1 saccade R (normal for age)   Convergence/Divergence: 9.5 cm    VESTIBULAR - OCULAR REFLEX:    Slow VOR: Normal and Comment: c/o dizziness and neck  stiffness B planes   VOR Cancellation: Normal   Head-Impulse Test: HIT Right: negative HIT Left: positive     POSITIONAL TESTING:  Right Roll Test: negative Left Roll Test: negative  Right Loaded Dix-Hallpike: limited by neck pain; R upbeating torsional nystagmus but pt asymptomatic Left Loaded Dix-Hallpike: NT                                                                                                                               TREATMENT DATE: 09/08/23    PATIENT EDUCATION: Education details: exam findings, prognosis, POC, edu on BPPV and plan for CRM next session Person educated: Patient Education method: Medical illustrator Education comprehension: verbalized understanding  HOME EXERCISE PROGRAM: Not initiated  GOALS: Goals reviewed with patient? Yes  SHORT TERM GOALS: Target date: 10/06/2023  Patient to be independent with initial HEP. Baseline: HEP initiated Goal status: IN PROGRESS    LONG TERM GOALS: Target date: 11/03/2023  Patient to be  independent with advanced HEP. Baseline: Not yet initiated  Goal status: IN PROGRESS  Patient to report 0/10 dizziness with standing vertical and horizontal VOR for 30 seconds. Baseline: Unable Goal status: IN PROGRESS  Patient will report 0/10 dizziness with bed mobility.  Baseline: Symptomatic  Goal status: IN PROGRESS  Patient to demonstrate cervical AROM WFL and without pain limiting.  Baseline: see above Goal status: IN PROGRESS  Patient to score at least 19/24 on DGI in order to decrease risk of falls. Baseline: 10/24 Goal status: IN PROGRESS  Patient to report no falls in the past 6 weeks.  Baseline: 7 falls since concussion  Goal status: IN PROGRESS  Patient to score at least 18 points less on DHI in order to meet MCID and improve functional outcomes.  Baseline: 76 Goal status: IN PROGRESS    ASSESSMENT:  CLINICAL IMPRESSION:  Reports symptoms largely the same. Dynamic Gait Index  score of 10/24 indicating high risk for falls and symptoms provoked with head movements during ambulation.  Review to HEP with good return demonstration and experiencing motion sensitivity with change of position. No nystagmus present to sidelying or log rolling positions. Cervical spine ROM grossly limited all directions. Pt reports increased difficulty with quicker paced turns and generally poor balance.  Initiated standing balance activities at counter for UE support to progress multisensory balance demands.   OBJECTIVE IMPAIRMENTS: Abnormal gait, decreased activity tolerance, decreased balance, decreased coordination, decreased ROM, dizziness, impaired flexibility, postural dysfunction, and pain.   ACTIVITY LIMITATIONS: carrying, lifting, bending, sitting, standing, squatting, sleeping, stairs, transfers, bed mobility, bathing, toileting, dressing, reach over head, hygiene/grooming, locomotion level, and caring for others  PARTICIPATION LIMITATIONS: meal prep, cleaning, laundry, driving, shopping, community activity, and church  PERSONAL FACTORS: Age, Past/current experiences, Time since onset of injury/illness/exacerbation, and 3+ comorbidities: Asthma, a-fib, bipolar disorder, depression, DM, fibromyalgia, HLD, HTN, neuropathy  are also affecting patient's functional outcome.   REHAB POTENTIAL: Good  CLINICAL DECISION MAKING: Evolving/moderate complexity  EVALUATION COMPLEXITY: Moderate  PLAN:  PT FREQUENCY: 2x/week  PT DURATION: 8 weeks  PLANNED INTERVENTIONS: 97164- PT Re-evaluation, 97750- Physical Performance Testing, 97110-Therapeutic exercises, 97530- Therapeutic activity, [id number]- Neuromuscular re-education, 97535- Self Care, 16109- Manual therapy, [phone]- Gait training, [phone]- Canalith repositioning, [id number]- Aquatic Therapy, [phone]- Electrical stimulation (unattended), Patient/Family education, Balance training, Stair training, Taping, Dry Needling, Joint mobilization, Spinal  mobilization, Vestibular training, Cryotherapy, and Moist heat  PLAN FOR NEXT SESSION:  motion sensitivity and cervical ROM   10:22 AM, 09/22/23 M. Kelly Makaela Cando, PT, DPT Physical Therapist- [place] Office Number: [phone]

## 2023-09-23 ENCOUNTER — Ambulatory Visit: Admitting: Family Medicine

## 2023-09-23 ENCOUNTER — Encounter: Payer: Self-pay | Admitting: Family Medicine

## 2023-09-23 VITALS — BP 118/80 | HR 78 | Ht 70.0 in | Wt 258.0 lb

## 2023-09-23 DIAGNOSIS — R42 Dizziness and giddiness: Secondary | ICD-10-CM | POA: Diagnosis not present

## 2023-09-23 DIAGNOSIS — F0781 Postconcussional syndrome: Secondary | ICD-10-CM | POA: Insufficient documentation

## 2023-09-23 DIAGNOSIS — R413 Other amnesia: Secondary | ICD-10-CM | POA: Diagnosis not present

## 2023-09-23 DIAGNOSIS — F902 Attention-deficit hyperactivity disorder, combined type: Secondary | ICD-10-CM | POA: Insufficient documentation

## 2023-09-23 DIAGNOSIS — F5101 Primary insomnia: Secondary | ICD-10-CM

## 2023-09-23 DIAGNOSIS — M542 Cervicalgia: Secondary | ICD-10-CM

## 2023-09-23 MED ORDER — BELSOMRA 10 MG PO TABS: 1.0000 | ORAL_TABLET | Freq: Every day | ORAL | 1 refills | Status: DC

## 2023-09-23 MED ORDER — LISDEXAMFETAMINE DIMESYLATE 20 MG PO CAPS: 20.0000 mg | ORAL_CAPSULE | Freq: Every day | ORAL | 0 refills | Status: DC
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
## 2023-09-23 NOTE — Patient Instructions (Addendum)
 Thank you for coming in today.   Start belsomra for insomnia and vyvanse in the morning for ADHD symptoms.   Recheck in 2 weeks.

## 2023-09-27 ENCOUNTER — Ambulatory Visit

## 2023-09-27 DIAGNOSIS — M542 Cervicalgia: Secondary | ICD-10-CM | POA: Diagnosis not present

## 2023-09-27 DIAGNOSIS — R41841 Cognitive communication deficit: Secondary | ICD-10-CM

## 2023-09-27 DIAGNOSIS — R2681 Unsteadiness on feet: Secondary | ICD-10-CM

## 2023-09-27 DIAGNOSIS — R42 Dizziness and giddiness: Secondary | ICD-10-CM

## 2023-09-27 DIAGNOSIS — H8112 Benign paroxysmal vertigo, left ear: Secondary | ICD-10-CM | POA: Diagnosis not present

## 2023-09-27 DIAGNOSIS — R413 Other amnesia: Secondary | ICD-10-CM | POA: Diagnosis not present

## 2023-09-27 NOTE — Patient Instructions (Addendum)
    Turn on something you are interested in on TV. Set a timer for 2 minutes and alternate back and forth between written material for 2 minutes and then news or some other thing on TV for two minutes. Continue alternating back and forth for 20 minutes total.  Do this at least twice a day.
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
## 2023-09-27 NOTE — Therapy (Signed)
 OUTPATIENT PHYSICAL THERAPY VESTIBULAR TREATMENT   Patient Name: Antonio Andrade MRN: [id number] DOB:Nov 15, 1950, 73 y.o., male Today's Date: 09/27/2023   PCP: Ronna Coho, MD  REFERRING PROVIDER: Syliva Even, MD  END OF SESSION:  PT End of Session - 09/27/23 0939     Visit Number 4    Number of Visits 17    Date for PT Re-Evaluation 11/03/23    Authorization Type [employer]    Authorization Time Period approved 17 visits from 09/08/2023-11/03/2023    Authorization - Visit Number 4    Authorization - Number of Visits 17    PT Start Time 0935    PT Stop Time 1015    PT Time Calculation (min) 40 min    Activity Tolerance Patient tolerated treatment well    Behavior During Therapy WFL for tasks assessed/performed              Past Medical History:  Diagnosis Date   Acid reflux    Asthma    Atrial fibrillation (HCC)    Bipolar 1 disorder (HCC)    BPH (benign prostatic hyperplasia)    C. difficile colitis    Depression    Diabetes mellitus without complication (HCC)    Fatty liver    Fibromyalgia    Hyperlipidemia    Hypertension    Hypertensive retinopathy    Hypothyroidism    Insomnia    Low vitamin B12 level    Neuropathy    Obesity    Positive FIT (fecal immunochemical test)    Past Surgical History:  Procedure Laterality Date   CHOLECYSTECTOMY     Patient Active Problem List   Diagnosis Date Noted   Attention deficit hyperactivity disorder (ADHD), combined type 09/23/2023   Postconcussion syndrome 09/23/2023   Obesity 03/09/2018   Cellulitis 03/07/2018   Type 2 diabetes mellitus, controlled (HCC) 03/03/2018   Chronic diastolic CHF (congestive heart failure) (HCC) 03/03/2018   Cellulitis of left lower leg 03/03/2018   Weight gain 03/02/2018   Type 2 diabetes mellitus (HCC) 03/02/2018   Diabetic neuropathy (HCC) 03/02/2018   Hypothyroidism 03/02/2018   Chronic anemia 03/02/2018   HTN (hypertension) 03/02/2018   Depression 03/02/2018   Mood  disorder (HCC) 03/02/2018   HLD (hyperlipidemia) 03/02/2018   GERD (gastroesophageal reflux disease) 03/02/2018   Asthma 03/02/2018   Muscle tension dysphonia 03/26/2013   Nasal polyp 03/26/2013   Vocal cord atrophy 03/26/2013    ONSET DATE: 07/09/23  REFERRING DIAG: M54.2 (ICD-10-CM) - Neck pain  THERAPY DIAG:  Dizziness and giddiness  BPPV (benign paroxysmal positional vertigo), left  Unsteadiness on feet  Cervicalgia  Rationale for Evaluation and Treatment: Rehabilitation  SUBJECTIVE:  SUBJECTIVE STATEMENT: Been experiening come left side neck pain/spasm, higher up towards where head/neck meet   Pt accompanied by: self  PERTINENT HISTORY: Asthma, a-fib, bipolar disorder, depression, DM, fibromyalgia, HLD, HTN, neuropathy   PAIN:  Are you having pain? Yes: NPRS scale: 5/10 Pain location: left suboccipital,  fingers (always a 9-10) Pain description: aching, shooting, tingling, sharp Aggravating factors: pt unsure Relieving factors: chiropractor   PRECAUTIONS: Fall  RED FLAGS: None   WEIGHT BEARING RESTRICTIONS: No  FALLS: Has patient fallen in last 6 months? Yes. Number of falls 10  LIVING ENVIRONMENT: Lives with: lives alone Lives in: House/apartment Stairs: no Has following equipment at home: Single point cane, Environmental consultant - 4 wheeled, Tour manager, Grab bars, and None  PLOF: Independent and Vocation/Vocational requirements: retired; pt sits to do dishes and doesn't vacuum as much (reports this was the case at [facility] but he is more fatigued now)  PATIENT GOALS: improve dizziness  OBJECTIVE:   TODAY'S TREATMENT: 09/27/23 Activity Comments  C-spine MPH in pillow case to neck Slow paced cervical extension SNAG 2x2 min  Sub-occipital release W/ tennis ball "peanut" in supine.  Instruction in chin retractions  Rolling left/right 1/10 dizziness  Left Dix-Hallpike No symptoms  Right Dix-Hallpike Slow, small amplitude right upbeating x 15 sec  R Epley maneuver    Right Dix-Hallpike Slight decrease in dizziness and no nystagmus  Pt education Regarding benefit of cardiovascular exercise and/or general exercise to progress his recovery. Pt cites other issues for mobility and body pain, provided example of "seated exercise for seniors" on Youtube for instruction                CERVICAL ROM:   Active ROM AROM (deg) eval  Flexion 30  Extension 17 *pain  Right lateral flexion 16 *pain  Left lateral flexion 11 *pain  Right rotation 29 *pain  Left rotation 32 *pain    (Blank rows = not tested)    HOME EXERCISE PROGRAM Last updated: 09/20/23 Access Code: [facility] URL: [email or web address] Date: 09/20/2023 Prepared by: [facility] - Outpatient  Rehab - [facility]  Program Notes do not push into intense pain!  Exercises - Brandt-Daroff Vestibular Exercise  - 1 x daily - 5 x weekly - 2 sets - 3-5 reps - Right Sidelying to Left Sidelying Vestibular Habituation  - 1 x daily - 5 x weekly - 2 sets - 3-5 reps - Supine to Right Sidelying Vestibular Habituation  - 1 x daily - 5 x weekly - 2 sets - 3-5 reps - Seated Cervical Rotation AROM  - 1 x daily - 5 x weekly - 2 sets - 10 reps - Seated Cervical Flexion AROM  - 1 x daily - 5 x weekly - 2 sets - 10 reps - Mid-Lower Cervical Extension SNAG with Strap  - 1 x daily - 5 x weekly - 2 sets - 10 reps - Feet Together Balance at [employer] Eyes Closed  - 1 x daily - 7 x weekly - 3 sets - 15 sec hold - Semi-Tandem Balance at [employer] Eyes Open  - 1 x daily - 7 x weekly - 3 sets - 15 sec hold - Supine Suboccipital Release with Tennis Balls  - 1 x daily - 7 x weekly - 3 sets - 10 reps  PATIENT EDUCATION: Education details: answered pt's questions on OT- he requests referral d/t difficulty with hand  dexterity; edu on habituation to address dizziness; HEP with edu for safety and avoiding pain,  edu on massage and heat to assist with pain levels  Person educated: Patient Education method: Explanation, Demonstration, Tactile cues, Verbal cues, and Handouts Education comprehension: verbalized understanding and returned demonstration      Note: Objective measures were completed at Evaluation unless otherwise noted.  DIAGNOSTIC FINDINGS: 08/23/23 cervical xray: mild-to-moderate degenerative joint changes of the mid to lower cervical spine  COGNITION: Overall cognitive status: Within functional limits for tasks assessed   SENSATION: Reports pain and numbness in feet and hands d/t neuropathy   COORDINATION: Alternating pronation/supination: WNL Alternating toe tap: WNL Finger to nose: slightly slowed R   POSTURE: rounded shoulders, forward head, and increased thoracic kyphosis  GAIT: Findings: Comments: slow, flexed trunk   PATIENT SURVEYS:  DHI 76/100   VESTIBULAR ASSESSMENT   GENERAL OBSERVATION: pt wears progressives   OCULOMOTOR EXAM: Ocular Alignment: Cover/Uncover Test: WNL Cover/Cross Cover Test: WNL   Ocular ROM: limited R eye inferior ROM   Spontaneous Nystagmus: absent   Gaze-Induced Nystagmus: absent   Smooth Pursuits: intact   Saccades: 2 saccades vertically, 1 saccade R (normal for age)   Convergence/Divergence: 9.5 cm    VESTIBULAR - OCULAR REFLEX:    Slow VOR: Normal and Comment: c/o dizziness and neck stiffness B planes   VOR Cancellation: Normal   Head-Impulse Test: HIT Right: negative HIT Left: positive     POSITIONAL TESTING:  Right Roll Test: negative Left Roll Test: negative  Right Loaded Dix-Hallpike: limited by neck pain; R upbeating torsional nystagmus but pt asymptomatic Left Loaded Dix-Hallpike: NT                                                                                                                               TREATMENT  DATE: 09/08/23    PATIENT EDUCATION: Education details: exam findings, prognosis, POC, edu on BPPV and plan for CRM next session Person educated: Patient Education method: Medical illustrator Education comprehension: verbalized understanding  HOME EXERCISE PROGRAM: Not initiated  GOALS: Goals reviewed with patient? Yes  SHORT TERM GOALS: Target date: 10/06/2023  Patient to be independent with initial HEP. Baseline: HEP initiated Goal status: IN PROGRESS    LONG TERM GOALS: Target date: 11/03/2023  Patient to be independent with advanced HEP. Baseline: Not yet initiated  Goal status: IN PROGRESS  Patient to report 0/10 dizziness with standing vertical and horizontal VOR for 30 seconds. Baseline: Unable Goal status: IN PROGRESS  Patient will report 0/10 dizziness with bed mobility.  Baseline: Symptomatic  Goal status: IN PROGRESS  Patient to demonstrate cervical AROM WFL and without pain limiting.  Baseline: see above Goal status: IN PROGRESS  Patient to score at least 19/24 on DGI in order to decrease risk of falls. Baseline: 10/24 Goal status: IN PROGRESS  Patient to report no falls in the past 6 weeks.  Baseline: 7 falls since concussion  Goal status: IN PROGRESS  Patient to score at least 18 points less on DHI in  order to meet MCID and improve functional outcomes.  Baseline: 76 Goal status: IN PROGRESS    ASSESSMENT:  CLINICAL IMPRESSION: Reports ongoing issues of dizziness/motion sensitivity with position change and/or certain movements and notes experiencing left upper neck pain/spasm. Provided tennis ball "peanut" for self-release at home for suboccipitals and tolerated this well and reports relief. Reduced sensitivity to rolling left/right report 1/10 symptoms. R Dix-Hallpike reveals small, slow amplitude right upbeating and report of slight symptoms. Performed right Epley and absent nystagmus to repeat testing but reports same symptoms  experienced.  Educated on benefits of sub-threshold cardiovascular exercise for concussion recovery.   OBJECTIVE IMPAIRMENTS: Abnormal gait, decreased activity tolerance, decreased balance, decreased coordination, decreased ROM, dizziness, impaired flexibility, postural dysfunction, and pain.   ACTIVITY LIMITATIONS: carrying, lifting, bending, sitting, standing, squatting, sleeping, stairs, transfers, bed mobility, bathing, toileting, dressing, reach over head, hygiene/grooming, locomotion level, and caring for others  PARTICIPATION LIMITATIONS: meal prep, cleaning, laundry, driving, shopping, community activity, and church  PERSONAL FACTORS: Age, Past/current experiences, Time since onset of injury/illness/exacerbation, and 3+ comorbidities: Asthma, a-fib, bipolar disorder, depression, DM, fibromyalgia, HLD, HTN, neuropathy  are also affecting patient's functional outcome.   REHAB POTENTIAL: Good  CLINICAL DECISION MAKING: Evolving/moderate complexity  EVALUATION COMPLEXITY: Moderate  PLAN:  PT FREQUENCY: 2x/week  PT DURATION: 8 weeks  PLANNED INTERVENTIONS: 97164- PT Re-evaluation, 97750- Physical Performance Testing, 97110-Therapeutic exercises, 97530- Therapeutic activity, [id number]- Neuromuscular re-education, 97535- Self Care, 16109- Manual therapy, [phone]- Gait training, [phone]- Canalith repositioning, [id number]- Aquatic Therapy, [phone]- Electrical stimulation (unattended), Patient/Family education, Balance training, Stair training, Taping, Dry Needling, Joint mobilization, Spinal mobilization, Vestibular training, Cryotherapy, and Moist heat  PLAN FOR NEXT SESSION:  motion sensitivity and cervical ROM   9:41 AM, 09/27/23 M. Kelly Crisol Muecke, PT, DPT Physical Therapist- [place] Office Number: [phone]

## 2023-09-28 NOTE — Therapy (Incomplete)
 OUTPATIENT PHYSICAL THERAPY VESTIBULAR TREATMENT   Patient Name: Antonio Andrade MRN: [id number] DOB:[birth year], 73 y.o., male Today's Date: 09/28/2023   PCP: Ronna Coho, MD  REFERRING PROVIDER: Syliva Even, MD  END OF SESSION:     Past Medical History:  Diagnosis Date   Acid reflux    Asthma    Atrial fibrillation (HCC)    Bipolar 1 disorder (HCC)    BPH (benign prostatic hyperplasia)    C. difficile colitis    Depression    Diabetes mellitus without complication (HCC)    Fatty liver    Fibromyalgia    Hyperlipidemia    Hypertension    Hypertensive retinopathy    Hypothyroidism    Insomnia    Low vitamin B12 level    Neuropathy    Obesity    Positive FIT (fecal immunochemical test)    Past Surgical History:  Procedure Laterality Date   CHOLECYSTECTOMY     Patient Active Problem List   Diagnosis Date Noted   Attention deficit hyperactivity disorder (ADHD), combined type 09/23/2023   Postconcussion syndrome 09/23/2023   Obesity 03/09/2018   Cellulitis 03/07/2018   Type 2 diabetes mellitus, controlled (HCC) 03/03/2018   Chronic diastolic CHF (congestive heart failure) (HCC) 03/03/2018   Cellulitis of left lower leg 03/03/2018   Weight gain 03/02/2018   Type 2 diabetes mellitus (HCC) 03/02/2018   Diabetic neuropathy (HCC) 03/02/2018   Hypothyroidism 03/02/2018   Chronic anemia 03/02/2018   HTN (hypertension) 03/02/2018   Depression 03/02/2018   Mood disorder (HCC) 03/02/2018   HLD (hyperlipidemia) 03/02/2018   GERD (gastroesophageal reflux disease) 03/02/2018   Asthma 03/02/2018   Muscle tension dysphonia 03/26/2013   Nasal polyp 03/26/2013   Vocal cord atrophy 03/26/2013    ONSET DATE: 07/09/23  REFERRING DIAG: M54.2 (ICD-10-CM) - Neck pain  THERAPY DIAG:  No diagnosis found.  Rationale for Evaluation and Treatment: Rehabilitation  SUBJECTIVE:                                                                                                                                                                                              SUBJECTIVE STATEMENT: Been experiening come left side neck pain/spasm, higher up towards where head/neck meet   Pt accompanied by: self  PERTINENT HISTORY: Asthma, a-fib, bipolar disorder, depression, DM, fibromyalgia, HLD, HTN, neuropathy   PAIN:  Are you having pain? Yes: NPRS scale: 5/10 Pain location: left suboccipital,  fingers (always a 9-10) Pain description: aching, shooting, tingling, sharp Aggravating factors: pt unsure Relieving factors: chiropractor   PRECAUTIONS: Fall  RED FLAGS: None   WEIGHT BEARING RESTRICTIONS: No  FALLS: Has patient fallen in last 6 months? Yes. Number of falls 10  LIVING ENVIRONMENT: Lives with: lives alone Lives in: House/apartment Stairs: no Has following equipment at home: Single point cane, Environmental consultant - 4 wheeled, Tour manager, Grab bars, and None  PLOF: Independent and Vocation/Vocational requirements: retired; pt sits to do dishes and doesn't vacuum as much (reports this was the case at [facility] but he is more fatigued now)  PATIENT GOALS: improve dizziness  OBJECTIVE:      TODAY'S TREATMENT: 09/29/23 Activity Comments                       TODAY'S TREATMENT: 09/27/23 Activity Comments  C-spine MPH in pillow case to neck Slow paced cervical extension SNAG 2x2 min  Sub-occipital release W/ tennis ball peanut in supine. Instruction in chin retractions  Rolling left/right 1/10 dizziness  Left Dix-Hallpike No symptoms  Right Dix-Hallpike Slow, small amplitude right upbeating x 15 sec  R Epley maneuver    Right Dix-Hallpike Slight decrease in dizziness and no nystagmus  Pt education Regarding benefit of cardiovascular exercise and/or general exercise to progress his recovery. Pt cites other issues for mobility and body pain, provided example of seated exercise for seniors on Youtube for instruction                CERVICAL ROM:    Active ROM AROM (deg) eval  Flexion 30  Extension 17 *pain  Right lateral flexion 16 *pain  Left lateral flexion 11 *pain  Right rotation 29 *pain  Left rotation 32 *pain    (Blank rows = not tested)    HOME EXERCISE PROGRAM Last updated: 09/20/23 Access Code: [facility] URL: [email or web address] Date: 09/20/2023 Prepared by: [facility] - Outpatient  Rehab - [facility]  Program Notes do not push into intense pain!  Exercises - Brandt-Daroff Vestibular Exercise  - 1 x daily - 5 x weekly - 2 sets - 3-5 reps - Right Sidelying to Left Sidelying Vestibular Habituation  - 1 x daily - 5 x weekly - 2 sets - 3-5 reps - Supine to Right Sidelying Vestibular Habituation  - 1 x daily - 5 x weekly - 2 sets - 3-5 reps - Seated Cervical Rotation AROM  - 1 x daily - 5 x weekly - 2 sets - 10 reps - Seated Cervical Flexion AROM  - 1 x daily - 5 x weekly - 2 sets - 10 reps - Mid-Lower Cervical Extension SNAG with Strap  - 1 x daily - 5 x weekly - 2 sets - 10 reps - Feet Together Balance at [employer] Eyes Closed  - 1 x daily - 7 x weekly - 3 sets - 15 sec hold - Semi-Tandem Balance at [employer] Eyes Open  - 1 x daily - 7 x weekly - 3 sets - 15 sec hold - Supine Suboccipital Release with Tennis Balls  - 1 x daily - 7 x weekly - 3 sets - 10 reps  PATIENT EDUCATION: Education details: answered pt's questions on OT- he requests referral d/t difficulty with hand dexterity; edu on habituation to address dizziness; HEP with edu for safety and avoiding pain, edu on massage and heat to assist with pain levels  Person educated: Patient Education method: Explanation, Demonstration, Tactile cues, Verbal cues, and Handouts Education comprehension: verbalized understanding and returned demonstration      Note: Objective measures were completed at Evaluation unless otherwise noted.  DIAGNOSTIC FINDINGS: 08/23/23 cervical xray: mild-to-moderate degenerative  joint changes of the mid to  lower cervical spine  COGNITION: Overall cognitive status: Within functional limits for tasks assessed   SENSATION: Reports pain and numbness in feet and hands d/t neuropathy   COORDINATION: Alternating pronation/supination: WNL Alternating toe tap: WNL Finger to nose: slightly slowed R   POSTURE: rounded shoulders, forward head, and increased thoracic kyphosis  GAIT: Findings: Comments: slow, flexed trunk   PATIENT SURVEYS:  DHI 76/100   VESTIBULAR ASSESSMENT   GENERAL OBSERVATION: pt wears progressives   OCULOMOTOR EXAM: Ocular Alignment: Cover/Uncover Test: WNL Cover/Cross Cover Test: WNL   Ocular ROM: limited R eye inferior ROM   Spontaneous Nystagmus: absent   Gaze-Induced Nystagmus: absent   Smooth Pursuits: intact   Saccades: 2 saccades vertically, 1 saccade R (normal for age)   Convergence/Divergence: 9.5 cm    VESTIBULAR - OCULAR REFLEX:    Slow VOR: Normal and Comment: c/o dizziness and neck stiffness B planes   VOR Cancellation: Normal   Head-Impulse Test: HIT Right: negative HIT Left: positive     POSITIONAL TESTING:  Right Roll Test: negative Left Roll Test: negative  Right Loaded Dix-Hallpike: limited by neck pain; R upbeating torsional nystagmus but pt asymptomatic Left Loaded Dix-Hallpike: NT                                                                                                                               TREATMENT DATE: 09/08/23    PATIENT EDUCATION: Education details: exam findings, prognosis, POC, edu on BPPV and plan for CRM next session Person educated: Patient Education method: Medical illustrator Education comprehension: verbalized understanding  HOME EXERCISE PROGRAM: Not initiated  GOALS: Goals reviewed with patient? Yes  SHORT TERM GOALS: Target date: 10/06/2023  Patient to be independent with initial HEP. Baseline: HEP initiated Goal status: IN PROGRESS    LONG TERM GOALS: Target date:  11/03/2023  Patient to be independent with advanced HEP. Baseline: Not yet initiated  Goal status: IN PROGRESS  Patient to report 0/10 dizziness with standing vertical and horizontal VOR for 30 seconds. Baseline: Unable Goal status: IN PROGRESS  Patient will report 0/10 dizziness with bed mobility.  Baseline: Symptomatic  Goal status: IN PROGRESS  Patient to demonstrate cervical AROM WFL and without pain limiting.  Baseline: see above Goal status: IN PROGRESS  Patient to score at least 19/24 on DGI in order to decrease risk of falls. Baseline: 10/24 Goal status: IN PROGRESS  Patient to report no falls in the past 6 weeks.  Baseline: 7 falls since concussion  Goal status: IN PROGRESS  Patient to score at least 18 points less on DHI in order to meet MCID and improve functional outcomes.  Baseline: 76 Goal status: IN PROGRESS    ASSESSMENT:  CLINICAL IMPRESSION: Reports ongoing issues of dizziness/motion sensitivity with position change and/or certain movements and notes experiencing left upper neck pain/spasm. Provided tennis ball peanut for self-release at home for suboccipitals and tolerated  this well and reports relief. Reduced sensitivity to rolling left/right report 1/10 symptoms. R Dix-Hallpike reveals small, slow amplitude right upbeating and report of slight symptoms. Performed right Epley and absent nystagmus to repeat testing but reports same symptoms experienced.  Educated on benefits of sub-threshold cardiovascular exercise for concussion recovery.   OBJECTIVE IMPAIRMENTS: Abnormal gait, decreased activity tolerance, decreased balance, decreased coordination, decreased ROM, dizziness, impaired flexibility, postural dysfunction, and pain.   ACTIVITY LIMITATIONS: carrying, lifting, bending, sitting, standing, squatting, sleeping, stairs, transfers, bed mobility, bathing, toileting, dressing, reach over head, hygiene/grooming, locomotion level, and caring for  others  PARTICIPATION LIMITATIONS: meal prep, cleaning, laundry, driving, shopping, community activity, and church  PERSONAL FACTORS: Age, Past/current experiences, Time since onset of injury/illness/exacerbation, and 3+ comorbidities: Asthma, a-fib, bipolar disorder, depression, DM, fibromyalgia, HLD, HTN, neuropathy  are also affecting patient's functional outcome.   REHAB POTENTIAL: Good  CLINICAL DECISION MAKING: Evolving/moderate complexity  EVALUATION COMPLEXITY: Moderate  PLAN:  PT FREQUENCY: 2x/week  PT DURATION: 8 weeks  PLANNED INTERVENTIONS: 97164- PT Re-evaluation, 97750- Physical Performance Testing, 97110-Therapeutic exercises, 97530- Therapeutic activity, [id number]- Neuromuscular re-education, 97535- Self Care, 40981- Manual therapy, [phone]- Gait training, [phone]- Canalith repositioning, [id number]- Aquatic Therapy, [phone]- Electrical stimulation (unattended), Patient/Family education, Balance training, Stair training, Taping, Dry Needling, Joint mobilization, Spinal mobilization, Vestibular training, Cryotherapy, and Moist heat  PLAN FOR NEXT SESSION:  motion sensitivity and cervical ROM

## 2023-09-29 ENCOUNTER — Ambulatory Visit: Admitting: Physical Therapy

## 2023-10-03 NOTE — Therapy (Signed)
 OUTPATIENT PHYSICAL THERAPY VESTIBULAR TREATMENT   Patient Name: Favio Moder MRN: [id number] DOB:05-Dec-1950, 73 y.o., male Today's Date: 10/04/2023   PCP: Ronna Coho, MD  REFERRING PROVIDER: Syliva Even, MD  END OF SESSION:  PT End of Session - 10/04/23 1018     Visit Number 5    Number of Visits 17    Date for PT Re-Evaluation 11/03/23    Authorization Type [employer] Medicare    Authorization Time Period approved 17 visits from 09/08/2023-11/03/2023    Authorization - Visit Number 5    Authorization - Number of Visits 17    PT Start Time 0928    PT Stop Time 1017    PT Time Calculation (min) 49 min    Activity Tolerance Patient tolerated treatment well   lightheadedness   Behavior During Therapy WFL for tasks assessed/performed            Past Medical History:  Diagnosis Date   Acid reflux    Asthma    Atrial fibrillation (HCC)    Bipolar 1 disorder (HCC)    BPH (benign prostatic hyperplasia)    C. difficile colitis    Depression    Diabetes mellitus without complication (HCC)    Fatty liver    Fibromyalgia    Hyperlipidemia    Hypertension    Hypertensive retinopathy    Hypothyroidism    Insomnia    Low vitamin B12 level    Neuropathy    Obesity    Positive FIT (fecal immunochemical test)    Past Surgical History:  Procedure Laterality Date   CHOLECYSTECTOMY     Patient Active Problem List   Diagnosis Date Noted   Attention deficit hyperactivity disorder (ADHD), combined type 09/23/2023   Postconcussion syndrome 09/23/2023   Obesity 03/09/2018   Cellulitis 03/07/2018   Type 2 diabetes mellitus, controlled (HCC) 03/03/2018   Chronic diastolic CHF (congestive heart failure) (HCC) 03/03/2018   Cellulitis of left lower leg 03/03/2018   Weight gain 03/02/2018   Type 2 diabetes mellitus (HCC) 03/02/2018   Diabetic neuropathy (HCC) 03/02/2018   Hypothyroidism 03/02/2018   Chronic anemia 03/02/2018   HTN (hypertension) 03/02/2018   Depression  03/02/2018   Mood disorder (HCC) 03/02/2018   HLD (hyperlipidemia) 03/02/2018   GERD (gastroesophageal reflux disease) 03/02/2018   Asthma 03/02/2018   Muscle tension dysphonia 03/26/2013   Nasal polyp 03/26/2013   Vocal cord atrophy 03/26/2013    ONSET DATE: 07/09/23  REFERRING DIAG: M54.2 (ICD-10-CM) - Neck pain  THERAPY DIAG:  Dizziness and giddiness  BPPV (benign paroxysmal positional vertigo), left  Unsteadiness on feet  Cervicalgia  Rationale for Evaluation and Treatment: Rehabilitation  SUBJECTIVE:  SUBJECTIVE STATEMENT: Today is a particularly dizzy/fuzzy day. Denies any particular changes in activities. Denies recent falls to the ground, but has stumbled into the wall a few times. Reports that neck pain was a little elevated after last session but dizziness was the same as it has been. Reports that he had an episode of blacking out on Saturday for a 1/2 a second- was next to the wall and steadied himself rather than falling.    Pt accompanied by: self  PERTINENT HISTORY: Asthma, a-fib, bipolar disorder, depression, DM, fibromyalgia, HLD, HTN, neuropathy   PAIN:  Are you having pain? Yes: NPRS scale: 4/10 Pain location: left suboccipital and B UT Pain description: aching, shooting, tingling, sharp Aggravating factors: pt unsure Relieving factors: chiropractor   PRECAUTIONS: Fall  RED FLAGS: None   WEIGHT BEARING RESTRICTIONS: No  FALLS: Has patient fallen in last 6 months? Yes. Number of falls 10  LIVING ENVIRONMENT: Lives with: lives alone Lives in: House/apartment Stairs: no Has following equipment at home: Single point cane, Environmental consultant - 4 wheeled, Tour manager, Grab bars, and None  PLOF: Independent and Vocation/Vocational requirements: retired; pt sits to do dishes and  doesn't vacuum as much (reports this was the case at [facility] but he is more fatigued now)  PATIENT GOALS: improve dizziness  OBJECTIVE:      TODAY'S TREATMENT: 10/04/23 Activity Comments  L DH Negative ; c/o dizziness upon sitting up  R DH Small amplitude R upbeating torsional nystagmus lasting ~40 sec; pt reports no improvement in dizziness while holding this position. c/o dizziness upon sitting up    R/L brandt daroff 1x each side Pt tolerated well despite symptoms. Reported less dizziness upon lying to the R compared to L. Dizziness upon sitting up from B sides   Pt reports a few minutes of lightheadedness spontaneously while conversing  97% spO2, 82bpm, 136/95 mmHg      Orthostatic Testing   Supine Sitting Standing  x1 Minute Standing x 3 Minutes  BP 141/92 mmHg 120/84 109/70 110/72  HR 81 bpm 87 *dizziness  96 96      HOME EXERCISE PROGRAM Last updated: 10/04/23 Access Code: [facility] URL: [email or web address] Date: 10/04/2023 Prepared by: [facility] - Outpatient  Rehab - [facility]  Program Notes do not push into pain. stop neck stretches if pain lingers  Exercises - Brandt-Daroff Vestibular Exercise  - 1 x daily - 5 x weekly - 2 sets - 3-5 reps - Right Sidelying to Left Sidelying Vestibular Habituation  - 1 x daily - 5 x weekly - 2 sets - 3-5 reps - Supine to Right Sidelying Vestibular Habituation  - 1 x daily - 5 x weekly - 2 sets - 3-5 reps - Seated Cervical Rotation AROM  - 1 x daily - 5 x weekly - 3 sets - 5 reps - Seated Cervical Flexion AROM  - 1 x daily - 5 x weekly - 3 sets - 5 reps - Mid-Lower Cervical Extension SNAG with Strap  - 1 x daily - 5 x weekly - 2 sets - 10 reps - Feet Together Balance at [employer] Eyes Closed  - 1 x daily - 7 x weekly - 3 sets - 15 sec hold - Semi-Tandem Balance at [employer] Eyes Open  - 1 x daily - 7 x weekly - 3 sets - 15 sec hold - Supine Suboccipital Release with Tennis Balls  - 1 x daily - 7 x weekly - 3  sets - 10 reps  PATIENT EDUCATION: Education details: edu on habituation rather than CRM for pt's remaining symptoms d/t symptoms not responding to his change in nystagmus; adjusted HEP frequency and intensity to reduce lasting pain after performing his neck HEP; edu on orhtostatic measures and self-management  Person educated: Patient Education method: Explanation, Demonstration, Tactile cues, Verbal cues, and Handouts Education comprehension: verbalized understanding and returned demonstration     Note: Objective measures were completed at Evaluation unless otherwise noted.  DIAGNOSTIC FINDINGS: 08/23/23 cervical xray: mild-to-moderate degenerative joint changes of the mid to lower cervical spine  COGNITION: Overall cognitive status: Within functional limits for tasks assessed   SENSATION: Reports pain and numbness in feet and hands d/t neuropathy   COORDINATION: Alternating pronation/supination: WNL Alternating toe tap: WNL Finger to nose: slightly slowed R   POSTURE: rounded shoulders, forward head, and increased thoracic kyphosis  GAIT: Findings: Comments: slow, flexed trunk   PATIENT SURVEYS:  DHI 76/100   VESTIBULAR ASSESSMENT   GENERAL OBSERVATION: pt wears progressives   OCULOMOTOR EXAM: Ocular Alignment: Cover/Uncover Test: WNL Cover/Cross Cover Test: WNL   Ocular ROM: limited R eye inferior ROM   Spontaneous Nystagmus: absent   Gaze-Induced Nystagmus: absent   Smooth Pursuits: intact   Saccades: 2 saccades vertically, 1 saccade R (normal for age)   Convergence/Divergence: 9.5 cm    VESTIBULAR - OCULAR REFLEX:    Slow VOR: Normal and Comment: c/o dizziness and neck stiffness B planes   VOR Cancellation: Normal   Head-Impulse Test: HIT Right: negative HIT Left: positive     POSITIONAL TESTING:  Right Roll Test: negative Left Roll Test: negative  Right Loaded Dix-Hallpike: limited by neck pain; R upbeating torsional nystagmus but pt  asymptomatic Left Loaded Dix-Hallpike: NT                                                                                                                               TREATMENT DATE: 09/08/23    PATIENT EDUCATION: Education details: exam findings, prognosis, POC, edu on BPPV and plan for CRM next session Person educated: Patient Education method: Medical illustrator Education comprehension: verbalized understanding  HOME EXERCISE PROGRAM: Not initiated  GOALS: Goals reviewed with patient? Yes  SHORT TERM GOALS: Target date: 10/06/2023  Patient to be independent with initial HEP. Baseline: HEP initiated Goal status: IN PROGRESS    LONG TERM GOALS: Target date: 11/03/2023  Patient to be independent with advanced HEP. Baseline: Not yet initiated  Goal status: IN PROGRESS  Patient to report 0/10 dizziness with standing vertical and horizontal VOR for 30 seconds. Baseline: Unable Goal status: IN PROGRESS  Patient will report 0/10 dizziness with bed mobility.  Baseline: Symptomatic  Goal status: IN PROGRESS  Patient to demonstrate cervical AROM WFL and without pain limiting.  Baseline: see above Goal status: IN PROGRESS  Patient to score at least 19/24 on DGI in order to decrease risk of falls. Baseline: 10/24 Goal status: IN PROGRESS  Patient  to report no falls in the past 6 weeks.  Baseline: 7 falls since concussion  Goal status: IN PROGRESS  Patient to score at least 18 points less on DHI in order to meet MCID and improve functional outcomes.  Baseline: 76 Goal status: IN PROGRESS    ASSESSMENT:  CLINICAL IMPRESSION: Patient arrived to session with report of increased dizziness today without known cause. Reports another episode of "blacking out for half a second" on Saturday but was able to steady himself on the wall.Positional testing revealed remaining very small amplitude nystagmus on R side however pt's symptoms remained constant in this position  and did not respond to dissipation of nystagmus. Thus, plan to tx patient with habituation as this nystagmus could be congenital and benign in nature or linked to hx of head trauma. Upon discussing HEP for improved cervical tolerance, patient reported lightheadedness for a few minutes. Subsequent orthostatics were positive and referring provider was notified. Patient was escorted safely to waiting room.   OBJECTIVE IMPAIRMENTS: Abnormal gait, decreased activity tolerance, decreased balance, decreased coordination, decreased ROM, dizziness, impaired flexibility, postural dysfunction, and pain.   ACTIVITY LIMITATIONS: carrying, lifting, bending, sitting, standing, squatting, sleeping, stairs, transfers, bed mobility, bathing, toileting, dressing, reach over head, hygiene/grooming, locomotion level, and caring for others  PARTICIPATION LIMITATIONS: meal prep, cleaning, laundry, driving, shopping, community activity, and church  PERSONAL FACTORS: Age, Past/current experiences, Time since onset of injury/illness/exacerbation, and 3+ comorbidities: Asthma, a-fib, bipolar disorder, depression, DM, fibromyalgia, HLD, HTN, neuropathy  are also affecting patient's functional outcome.   REHAB POTENTIAL: Good  CLINICAL DECISION MAKING: Evolving/moderate complexity  EVALUATION COMPLEXITY: Moderate  PLAN:  PT FREQUENCY: 2x/week  PT DURATION: 8 weeks  PLANNED INTERVENTIONS: 97164- PT Re-evaluation, 97750- Physical Performance Testing, 97110-Therapeutic exercises, 97530- Therapeutic activity, 97112- Neuromuscular re-education, 97535- Self Care, 98119- Manual therapy, [phone]- Gait training, [phone]- Canalith repositioning, [id number]- Aquatic Therapy, [id number]- Electrical stimulation (unattended), Patient/Family education, Balance training, Stair training, Taping, Dry Needling, Joint mobilization, Spinal mobilization, Vestibular training, Cryotherapy, and Moist heat  PLAN FOR NEXT SESSION:  motion sensitivity and  cervical ROM   Thaddeus Filippo, PT, DPT 10/04/23 10:19 AM  [facility] at [facility] [address], [address] [place], [place] [address] Phone # [phone] Fax # [phone]

## 2023-10-04 ENCOUNTER — Encounter: Payer: Self-pay | Admitting: Physical Therapy

## 2023-10-04 ENCOUNTER — Ambulatory Visit: Admitting: Physical Therapy

## 2023-10-04 ENCOUNTER — Telehealth: Payer: Self-pay | Admitting: Physical Therapy

## 2023-10-04 DIAGNOSIS — R42 Dizziness and giddiness: Secondary | ICD-10-CM | POA: Diagnosis not present

## 2023-10-04 DIAGNOSIS — R2681 Unsteadiness on feet: Secondary | ICD-10-CM | POA: Diagnosis not present

## 2023-10-04 DIAGNOSIS — M542 Cervicalgia: Secondary | ICD-10-CM

## 2023-10-04 DIAGNOSIS — H8112 Benign paroxysmal vertigo, left ear: Secondary | ICD-10-CM

## 2023-10-04 DIAGNOSIS — R41841 Cognitive communication deficit: Secondary | ICD-10-CM | POA: Diagnosis not present

## 2023-10-04 DIAGNOSIS — R413 Other amnesia: Secondary | ICD-10-CM | POA: Diagnosis not present

## 2023-10-04 NOTE — Telephone Encounter (Signed)
 Hi Dr. Alease Hunter,  I am seeing Mr. Dais in OPPT s/p concussion per your referral. The patient reports episodes of blacking out which last a split second. Please see orthostatic measures taken today on 10/04/23 as they may be contributing to his symptoms.Please advise if any changes to POC.   Thanks,  Thaddeus Filippo, PT, DPT 10/04/23 10:14 AM  [facility] at [facility] [address] [place], [address] [place], [place] [address] Phone # [phone] Fax # [phone]     Orthostatic Testing   Supine Sitting Standing  x1 Minute Standing x 3 Minutes  BP 141/92 mmHg 120/84 109/70 110/72  HR 81 bpm 87 *dizziness  96 96

## 2023-10-06 ENCOUNTER — Ambulatory Visit

## 2023-10-07 ENCOUNTER — Encounter: Admitting: Family Medicine

## 2023-10-11 ENCOUNTER — Ambulatory Visit: Admitting: Physical Therapy

## 2023-10-13 ENCOUNTER — Ambulatory Visit: Admitting: Physical Therapy

## 2023-10-14 ENCOUNTER — Other Ambulatory Visit: Payer: Self-pay | Admitting: Internal Medicine

## 2023-10-18 ENCOUNTER — Ambulatory Visit: Attending: Family Medicine | Admitting: Physical Therapy

## 2023-10-18 ENCOUNTER — Encounter: Payer: Self-pay | Admitting: Physical Therapy

## 2023-10-18 DIAGNOSIS — R2681 Unsteadiness on feet: Secondary | ICD-10-CM | POA: Insufficient documentation

## 2023-10-18 DIAGNOSIS — M542 Cervicalgia: Secondary | ICD-10-CM | POA: Diagnosis not present

## 2023-10-18 DIAGNOSIS — R42 Dizziness and giddiness: Secondary | ICD-10-CM | POA: Insufficient documentation

## 2023-10-18 DIAGNOSIS — H8112 Benign paroxysmal vertigo, left ear: Secondary | ICD-10-CM | POA: Diagnosis not present

## 2023-10-18 NOTE — Therapy (Signed)
 OUTPATIENT PHYSICAL THERAPY VESTIBULAR TREATMENT   Patient Name: Antonio Andrade MRN: [id number] DOB:03/31/1951, 73 y.o., male Today's Date: 10/18/2023   PCP: Kip Righter, MD  REFERRING PROVIDER: Joane Artist RAMAN, MD  END OF SESSION:  PT End of Session - 10/18/23 0938     Visit Number 6    Number of Visits 17    Date for PT Re-Evaluation 11/03/23    Authorization Type UHC Medicare    Authorization Time Period approved 17 visits from 09/08/2023-11/03/2023    Authorization - Visit Number 6    Authorization - Number of Visits 17    PT Start Time 0935    PT Stop Time 1014    PT Time Calculation (min) 39 min    Activity Tolerance Patient tolerated treatment well   lightheadedness   Behavior During Therapy WFL for tasks assessed/performed             Past Medical History:  Diagnosis Date   Acid reflux    Asthma    Atrial fibrillation (HCC)    Bipolar 1 disorder (HCC)    BPH (benign prostatic hyperplasia)    C. difficile colitis    Depression    Diabetes mellitus without complication (HCC)    Fatty liver    Fibromyalgia    Hyperlipidemia    Hypertension    Hypertensive retinopathy    Hypothyroidism    Insomnia    Low vitamin B12 level    Neuropathy    Obesity    Positive FIT (fecal immunochemical test)    Past Surgical History:  Procedure Laterality Date   CHOLECYSTECTOMY     Patient Active Problem List   Diagnosis Date Noted   Attention deficit hyperactivity disorder (ADHD), combined type 09/23/2023   Postconcussion syndrome 09/23/2023   Obesity 03/09/2018   Cellulitis 03/07/2018   Type 2 diabetes mellitus, controlled (HCC) 03/03/2018   Chronic diastolic CHF (congestive heart failure) (HCC) 03/03/2018   Cellulitis of left lower leg 03/03/2018   Weight gain 03/02/2018   Type 2 diabetes mellitus (HCC) 03/02/2018   Diabetic neuropathy (HCC) 03/02/2018   Hypothyroidism 03/02/2018   Chronic anemia 03/02/2018   HTN (hypertension) 03/02/2018   Depression  03/02/2018   Mood disorder (HCC) 03/02/2018   HLD (hyperlipidemia) 03/02/2018   GERD (gastroesophageal reflux disease) 03/02/2018   Asthma 03/02/2018   Muscle tension dysphonia 03/26/2013   Nasal polyp 03/26/2013   Vocal cord atrophy 03/26/2013    ONSET DATE: 07/09/23  REFERRING DIAG: M54.2 (ICD-10-CM) - Neck pain  THERAPY DIAG:  Dizziness and giddiness  Unsteadiness on feet  Rationale for Evaluation and Treatment: Rehabilitation  SUBJECTIVE:  SUBJECTIVE STATEMENT: Had a fall 1 week ago today and hurt myself.  I fell trying to get dessert to my friends at a dinner.  Hit the wall and had blood on the wall.  Called and spoke with the doctor on call-he thought maybe I had a concussion and friends kept checking on me.  Headache is gone.  Neck and shoulder pain is continuing.   Pt accompanied by: self  PERTINENT HISTORY: Asthma, a-fib, bipolar disorder, depression, DM, fibromyalgia, HLD, HTN, neuropathy   PAIN:  Are you having pain? Yes: NPRS scale: 6-7/10 Pain location: right shoulder  and B UT Pain description: aching, shooting, tingling, sharp Aggravating factors: pt unsure Relieving factors: chiropractor   PRECAUTIONS: Fall  RED FLAGS: None   WEIGHT BEARING RESTRICTIONS: No  FALLS: Has patient fallen in last 6 months? Yes. Number of falls 10  LIVING ENVIRONMENT: Lives with: lives alone Lives in: House/apartment Stairs: no Has following equipment at home: Single point cane, Environmental consultant - 4 wheeled, Tour manager, Grab bars, and None  PLOF: Independent and Vocation/Vocational requirements: retired; pt sits to do dishes and doesn't vacuum as much (reports this was the case at [facility] but he is more fatigued now)  PATIENT GOALS: improve dizziness  OBJECTIVE:   I am dizzy and lightheaded  today.  Dizziness rated as a 7/10; more of a lightheaded feeling, like I'm in a cloud.  Dizziness is more imbalance.  Haven't done exercises in the past week.   TODAY'S TREATMENT: 10/18/2023 Activity Comments  Vitals 116/73 HR 73   Review of some of HEP: Cervical rotation A/ROM Cervical flexion A/ROM Cervical extension SNAG with towel   Review of balance exercises: Romberg EC Tandem stance EO   Discussed general activity, aerobic activity at home   At counter: Sidestepping x 1 min Forward/back walk x 1 rep  Reports fatigue and pain in hips   NuStep, Level 2, 4 extremities x 5 min No c/o, reports feels better than I thought      HOME EXERCISE PROGRAM Last updated: 10/04/23 Access Code: [facility] URL: [email or web address] Date: 10/04/2023 Prepared by: [facility] - Outpatient  Rehab - [facility]  Program Notes do not push into pain. stop neck stretches if pain lingers  Exercises - Brandt-Daroff Vestibular Exercise  - 1 x daily - 5 x weekly - 2 sets - 3-5 reps - Right Sidelying to Left Sidelying Vestibular Habituation  - 1 x daily - 5 x weekly - 2 sets - 3-5 reps - Supine to Right Sidelying Vestibular Habituation  - 1 x daily - 5 x weekly - 2 sets - 3-5 reps - Seated Cervical Rotation AROM  - 1 x daily - 5 x weekly - 3 sets - 5 reps - Seated Cervical Flexion AROM  - 1 x daily - 5 x weekly - 3 sets - 5 reps - Mid-Lower Cervical Extension SNAG with Strap  - 1 x daily - 5 x weekly - 2 sets - 10 reps - Feet Together Balance at [employer] Eyes Closed  - 1 x daily - 7 x weekly - 3 sets - 15 sec hold - Semi-Tandem Balance at [employer] Eyes Open  - 1 x daily - 7 x weekly - 3 sets - 15 sec hold - Supine Suboccipital Release with Tennis Balls  - 1 x daily - 7 x weekly - 3 sets - 10 reps   PATIENT EDUCATION: Education details: Benefits of aerobic activity and exercise for post-concussion symptoms, return to doing  cervical and counter balance exercise as part of  HEP Person educated: Patient Education method: Explanation, Demonstration, Tactile cues, Verbal cues, and Handouts Education comprehension: verbalized understanding and returned demonstration     Note: Objective measures were completed at Evaluation unless otherwise noted.  DIAGNOSTIC FINDINGS: 08/23/23 cervical xray: mild-to-moderate degenerative joint changes of the mid to lower cervical spine  COGNITION: Overall cognitive status: Within functional limits for tasks assessed   SENSATION: Reports pain and numbness in feet and hands d/t neuropathy   COORDINATION: Alternating pronation/supination: WNL Alternating toe tap: WNL Finger to nose: slightly slowed R   POSTURE: rounded shoulders, forward head, and increased thoracic kyphosis  GAIT: Findings: Comments: slow, flexed trunk   PATIENT SURVEYS:  DHI 76/100   VESTIBULAR ASSESSMENT   GENERAL OBSERVATION: pt wears progressives   OCULOMOTOR EXAM: Ocular Alignment: Cover/Uncover Test: WNL Cover/Cross Cover Test: WNL   Ocular ROM: limited R eye inferior ROM   Spontaneous Nystagmus: absent   Gaze-Induced Nystagmus: absent   Smooth Pursuits: intact   Saccades: 2 saccades vertically, 1 saccade R (normal for age)   Convergence/Divergence: 9.5 cm    VESTIBULAR - OCULAR REFLEX:    Slow VOR: Normal and Comment: c/o dizziness and neck stiffness B planes   VOR Cancellation: Normal   Head-Impulse Test: HIT Right: negative HIT Left: positive     POSITIONAL TESTING:  Right Roll Test: negative Left Roll Test: negative  Right Loaded Dix-Hallpike: limited by neck pain; R upbeating torsional nystagmus but pt asymptomatic Left Loaded Dix-Hallpike: NT                                                                                                                               TREATMENT DATE: 09/08/23    PATIENT EDUCATION: Education details: exam findings, prognosis, POC, edu on BPPV and plan for CRM next session Person  educated: Patient Education method: Medical illustrator Education comprehension: verbalized understanding  HOME EXERCISE PROGRAM: Not initiated  GOALS: Goals reviewed with patient? Yes  SHORT TERM GOALS: Target date: 10/06/2023  Patient to be independent with initial HEP. Baseline: HEP initiated Goal status: IN PROGRESS    LONG TERM GOALS: Target date: 11/03/2023  Patient to be independent with advanced HEP. Baseline: Reviewed partial HEP with return demo 10/18/2023 Goal status: IN PROGRESS  Patient to report 0/10 dizziness with standing vertical and horizontal VOR for 30 seconds. Baseline: Unable Goal status: IN PROGRESS  Patient will report 0/10 dizziness with bed mobility.  Baseline: Symptomatic  Goal status: IN PROGRESS  Patient to demonstrate cervical AROM WFL and without pain limiting.  Baseline: see above Goal status: IN PROGRESS  Patient to score at least 19/24 on DGI in order to decrease risk of falls. Baseline: 10/24 Goal status: IN PROGRESS  Patient to report no falls in the past 6 weeks.  Baseline: 7 falls since concussion  Goal status: IN PROGRESS  Patient to score at least 18 points less on [place]  in order to meet MCID and improve functional outcomes.  Baseline: 76 Goal status: IN PROGRESS    ASSESSMENT:  CLINICAL IMPRESSION: Pt presents today with reports of a fall since the last time he was here.  He reports letting the doctor know. Skilled PT session focused on review of HEP and progression of general activity, balance exercises.  Pt return demonstrates cervical ROM and stretches as well as counter balance exercises as part of HEP with good technique.  He has not done these since his fall a week ago, so PT recommends that he start back to these.  Worked on general activity with sidestepping, forward/back walking-which he reports fatigues his legs; also worked on Nustep for gentle aerobic activity, which pt tolerates well.  At end of session, he  reports his symptoms are better, especially the cloudiness in his head.  Pt will continue to benefit from skilled PT towards goals for improved functional mobility and decreased fall risk.  No complaints at end of session  OBJECTIVE IMPAIRMENTS: Abnormal gait, decreased activity tolerance, decreased balance, decreased coordination, decreased ROM, dizziness, impaired flexibility, postural dysfunction, and pain.   ACTIVITY LIMITATIONS: carrying, lifting, bending, sitting, standing, squatting, sleeping, stairs, transfers, bed mobility, bathing, toileting, dressing, reach over head, hygiene/grooming, locomotion level, and caring for others  PARTICIPATION LIMITATIONS: meal prep, cleaning, laundry, driving, shopping, community activity, and church  PERSONAL FACTORS: Age, Past/current experiences, Time since onset of injury/illness/exacerbation, and 3+ comorbidities: Asthma, a-fib, bipolar disorder, depression, DM, fibromyalgia, HLD, HTN, neuropathy  are also affecting patient's functional outcome.   REHAB POTENTIAL: Good  CLINICAL DECISION MAKING: Evolving/moderate complexity  EVALUATION COMPLEXITY: Moderate  PLAN:  PT FREQUENCY: 2x/week  PT DURATION: 8 weeks  PLANNED INTERVENTIONS: 97164- PT Re-evaluation, 97750- Physical Performance Testing, 97110-Therapeutic exercises, 97530- Therapeutic activity, 97112- Neuromuscular re-education, 97535- Self Care, 02859- Manual therapy, [phone]- Gait training, [phone]- Canalith repositioning, [id number]- Aquatic Therapy, [phone]- Electrical stimulation (unattended), Patient/Family education, Balance training, Stair training, Taping, Dry Needling, Joint mobilization, Spinal mobilization, Vestibular training, Cryotherapy, and Moist heat  PLAN FOR NEXT SESSION:  Aerobic activity (How can he add this in at home?); balance exercises; motion sensitivity and cervical ROM   Greig Anon, PT 10/18/23 10:32 AM Phone: [phone] Fax: [phone]  [facility] at [facility] [address] [place], [place] [address] Phone # [phone] Fax # [phone]

## 2023-10-20 ENCOUNTER — Ambulatory Visit: Admitting: Physical Therapy

## 2023-10-20 ENCOUNTER — Encounter: Payer: Self-pay | Admitting: Physical Therapy

## 2023-10-20 DIAGNOSIS — R42 Dizziness and giddiness: Secondary | ICD-10-CM | POA: Diagnosis not present

## 2023-10-20 DIAGNOSIS — R2681 Unsteadiness on feet: Secondary | ICD-10-CM

## 2023-10-20 DIAGNOSIS — M542 Cervicalgia: Secondary | ICD-10-CM | POA: Diagnosis not present

## 2023-10-20 DIAGNOSIS — H8112 Benign paroxysmal vertigo, left ear: Secondary | ICD-10-CM | POA: Diagnosis not present

## 2023-10-20 NOTE — Therapy (Signed)
 OUTPATIENT PHYSICAL THERAPY VESTIBULAR TREATMENT   Patient Name: Antonio Andrade MRN: [id number] DOB:1950/04/30, 73 y.o., male Today's Date: 10/20/2023   PCP: Kip Righter, MD  REFERRING PROVIDER: Joane Artist RAMAN, MD  END OF SESSION:  PT End of Session - 10/20/23 0935     Visit Number 7    Number of Visits 17    Date for PT Re-Evaluation 11/03/23    Authorization Type [employer]    Authorization Time Period approved 17 visits from 09/08/2023-11/03/2023    Authorization - Number of Visits 17    PT Start Time 0934    PT Stop Time 1013    PT Time Calculation (min) 39 min    Activity Tolerance Patient tolerated treatment well   lightheadedness   Behavior During Therapy [facility] for tasks assessed/performed              Past Medical History:  Diagnosis Date   Acid reflux    Asthma    Atrial fibrillation (HCC)    Bipolar 1 disorder (HCC)    BPH (benign prostatic hyperplasia)    C. difficile colitis    Depression    Diabetes mellitus without complication (HCC)    Fatty liver    Fibromyalgia    Hyperlipidemia    Hypertension    Hypertensive retinopathy    Hypothyroidism    Insomnia    Low vitamin B12 level    Neuropathy    Obesity    Positive FIT (fecal immunochemical test)    Past Surgical History:  Procedure Laterality Date   CHOLECYSTECTOMY     Patient Active Problem List   Diagnosis Date Noted   Attention deficit hyperactivity disorder (ADHD), combined type 09/23/2023   Postconcussion syndrome 09/23/2023   Obesity 03/09/2018   Cellulitis 03/07/2018   Type 2 diabetes mellitus, controlled (HCC) 03/03/2018   Chronic diastolic CHF (congestive heart failure) (HCC) 03/03/2018   Cellulitis of left lower leg 03/03/2018   Weight gain 03/02/2018   Type 2 diabetes mellitus (HCC) 03/02/2018   Diabetic neuropathy (HCC) 03/02/2018   Hypothyroidism 03/02/2018   Chronic anemia 03/02/2018   HTN (hypertension) 03/02/2018   Depression 03/02/2018   Mood disorder (HCC)  03/02/2018   HLD (hyperlipidemia) 03/02/2018   GERD (gastroesophageal reflux disease) 03/02/2018   Asthma 03/02/2018   Muscle tension dysphonia 03/26/2013   Nasal polyp 03/26/2013   Vocal cord atrophy 03/26/2013    ONSET DATE: 07/09/23  REFERRING DIAG: M54.2 (ICD-10-CM) - Neck pain  THERAPY DIAG:  Dizziness and giddiness  Unsteadiness on feet  Rationale for Evaluation and Treatment: Rehabilitation  SUBJECTIVE:  SUBJECTIVE STATEMENT: Feeling okay.  Dizziness and lightheadedness is about 4/10.  Had to drive to [facility] and back yesterday and did okay with that.  No more falls. Have ordered a foot bike for home.   Pt accompanied by: self  PERTINENT HISTORY: Asthma, a-fib, bipolar disorder, depression, DM, fibromyalgia, HLD, HTN, neuropathy   PAIN:  Are you having pain? Yes: NPRS scale: 4-5/10 Pain location: right shoulder  and B UT Pain description: aching, shooting, tingling, sharp Aggravating factors: pt unsure Relieving factors: chiropractor   PRECAUTIONS: Fall  RED FLAGS: None   WEIGHT BEARING RESTRICTIONS: No  FALLS: Has patient fallen in last 6 months? Yes. Number of falls 10  LIVING ENVIRONMENT: Lives with: lives alone Lives in: House/apartment Stairs: no Has following equipment at home: Single point cane, Environmental consultant - 4 wheeled, Tour manager, Grab bars, and None  PLOF: Independent and Vocation/Vocational requirements: retired; pt sits to do dishes and doesn't vacuum as much (reports this was the case at [facility] but he is more fatigued now)  PATIENT GOALS: improve dizziness  OBJECTIVE:   Dizzy rated as 4/10, lightheaded rated as 2/10 Does reports he continues to do rolling and Brandt-Daroff exercises, dizziness is lessening but not quickly  TODAY'S TREATMENT:  10/20/2023 Activity Comments  Vitals:128/84, HR 72 bpm   Side step R and L, 2 x 30 sec Forward/back walk, 2 x 30 sec  Incr. Dizziness posterior direction  Romberg + EC + head turns/nods   Partial tandem with EO/EC 30 sec Increased unsteadiness with EC  Forward/back stepping Very difficult with backwards direction  Forward stepping/return to midline 10 reps Back stepping/return to midline, 10 reps with hinge at hips UE support Rates 1-2 point increase in dizziness  NuStep, Level 3, 4 extremities x 6 minutes       HOME EXERCISE PROGRAM Last updated: 10/04/23 Access Code: [facility] URL: [email or web address] Date: 10/04/2023 Prepared by: [facility] - Outpatient  Rehab - [facility]  Program Notes do not push into pain. stop neck stretches if pain lingers  Exercises - Brandt-Daroff Vestibular Exercise  - 1 x daily - 5 x weekly - 2 sets - 3-5 reps - Right Sidelying to Left Sidelying Vestibular Habituation  - 1 x daily - 5 x weekly - 2 sets - 3-5 reps - Supine to Right Sidelying Vestibular Habituation  - 1 x daily - 5 x weekly - 2 sets - 3-5 reps - Seated Cervical Rotation AROM  - 1 x daily - 5 x weekly - 3 sets - 5 reps - Seated Cervical Flexion AROM  - 1 x daily - 5 x weekly - 3 sets - 5 reps - Mid-Lower Cervical Extension SNAG with Strap  - 1 x daily - 5 x weekly - 2 sets - 10 reps - Feet Together Balance at [employer] Eyes Closed  - 1 x daily - 7 x weekly - 3 sets - 15 sec hold - Semi-Tandem Balance at [employer] Eyes Open  - 1 x daily - 7 x weekly - 3 sets - 15 sec hold - Supine Suboccipital Release with Tennis Balls  - 1 x daily - 7 x weekly - 3 sets - 10 reps   PATIENT EDUCATION: Education details: Continue current HEP Person educated: Patient Education method: Explanation, Demonstration, Tactile cues, Verbal cues, and Handouts Education comprehension: verbalized understanding and returned demonstration     Note: Objective measures were completed at  Evaluation unless otherwise noted.  DIAGNOSTIC FINDINGS: 08/23/23 cervical xray: mild-to-moderate degenerative joint  changes of the mid to lower cervical spine  COGNITION: Overall cognitive status: Within functional limits for tasks assessed   SENSATION: Reports pain and numbness in feet and hands d/t neuropathy   COORDINATION: Alternating pronation/supination: WNL Alternating toe tap: WNL Finger to nose: slightly slowed R   POSTURE: rounded shoulders, forward head, and increased thoracic kyphosis  GAIT: Findings: Comments: slow, flexed trunk   PATIENT SURVEYS:  DHI 76/100   VESTIBULAR ASSESSMENT   GENERAL OBSERVATION: pt wears progressives   OCULOMOTOR EXAM: Ocular Alignment: Cover/Uncover Test: WNL Cover/Cross Cover Test: WNL   Ocular ROM: limited R eye inferior ROM   Spontaneous Nystagmus: absent   Gaze-Induced Nystagmus: absent   Smooth Pursuits: intact   Saccades: 2 saccades vertically, 1 saccade R (normal for age)   Convergence/Divergence: 9.5 cm    VESTIBULAR - OCULAR REFLEX:    Slow VOR: Normal and Comment: c/o dizziness and neck stiffness B planes   VOR Cancellation: Normal   Head-Impulse Test: HIT Right: negative HIT Left: positive     POSITIONAL TESTING:  Right Roll Test: negative Left Roll Test: negative  Right Loaded Dix-Hallpike: limited by neck pain; R upbeating torsional nystagmus but pt asymptomatic Left Loaded Dix-Hallpike: NT                                                                                                                               TREATMENT DATE: 09/08/23    PATIENT EDUCATION: Education details: exam findings, prognosis, POC, edu on BPPV and plan for CRM next session Person educated: Patient Education method: Medical illustrator Education comprehension: verbalized understanding  HOME EXERCISE PROGRAM: Not initiated  GOALS: Goals reviewed with patient? Yes  SHORT TERM GOALS: Target date:  10/06/2023  Patient to be independent with initial HEP. Baseline: HEP initiated Goal status: IN PROGRESS    LONG TERM GOALS: Target date: 11/03/2023  Patient to be independent with advanced HEP. Baseline: Reviewed partial HEP with return demo 10/18/2023 Goal status: IN PROGRESS  Patient to report 0/10 dizziness with standing vertical and horizontal VOR for 30 seconds. Baseline: Unable Goal status: IN PROGRESS  Patient will report 0/10 dizziness with bed mobility.  Baseline: Symptomatic  Goal status: IN PROGRESS  Patient to demonstrate cervical AROM WFL and without pain limiting.  Baseline: see above Goal status: IN PROGRESS  Patient to score at least 19/24 on DGI in order to decrease risk of falls. Baseline: 10/24 Goal status: IN PROGRESS  Patient to report no falls in the past 6 weeks.  Baseline: 7 falls since concussion  Goal status: IN PROGRESS  Patient to score at least 18 points less on DHI in order to meet MCID and improve functional outcomes.  Baseline: 76 Goal status: IN PROGRESS    ASSESSMENT:  CLINICAL IMPRESSION: Pt presents today with no new complaints; his dizziness, lightheadedness and shoulder pain are all slightly decreased compared to last visit. Skilled PT session focused on balance exercises with vision  removed, with head motions and with some motion/balance strategy (forward/back stepping).  He has unsteadiness initially in posterior direction; with change to focus on hinge at hips with bilat UE support, posterior direction is more smooth.  He does report 1-2 point increase in dizziness with this backwards stepping motion, but it subsides quickly.   No complaints at end of session.  Pt will continue to benefit from skilled PT towards goals for improved functional mobility and decreased fall risk.   OBJECTIVE IMPAIRMENTS: Abnormal gait, decreased activity tolerance, decreased balance, decreased coordination, decreased ROM, dizziness, impaired flexibility,  postural dysfunction, and pain.   ACTIVITY LIMITATIONS: carrying, lifting, bending, sitting, standing, squatting, sleeping, stairs, transfers, bed mobility, bathing, toileting, dressing, reach over head, hygiene/grooming, locomotion level, and caring for others  PARTICIPATION LIMITATIONS: meal prep, cleaning, laundry, driving, shopping, community activity, and church  PERSONAL FACTORS: Age, Past/current experiences, Time since onset of injury/illness/exacerbation, and 3+ comorbidities: Asthma, a-fib, bipolar disorder, depression, DM, fibromyalgia, HLD, HTN, neuropathy  are also affecting patient's functional outcome.   REHAB POTENTIAL: Good  CLINICAL DECISION MAKING: Evolving/moderate complexity  EVALUATION COMPLEXITY: Moderate  PLAN:  PT FREQUENCY: 2x/week  PT DURATION: 8 weeks  PLANNED INTERVENTIONS: 97164- PT Re-evaluation, 97750- Physical Performance Testing, 97110-Therapeutic exercises, 97530- Therapeutic activity, 97112- Neuromuscular re-education, 97535- Self Care, [id number]- Manual therapy, [phone]- Gait training, [phone]- Canalith repositioning, [id number]- Aquatic Therapy, [id number]- Electrical stimulation (unattended), Patient/Family education, Balance training, Stair training, Taping, Dry Needling, Joint mobilization, Spinal mobilization, Vestibular training, Cryotherapy, and Moist heat  PLAN FOR NEXT SESSION:  Continue balance exercises-step strategy; motion sensitivity and cervical ROM   Greig Anon, PT 10/20/23 10:14 AM Phone: [phone] Fax: [phone]  [facility] at [facility] [address] [place], [place] [address] Phone # [phone] Fax # [phone]

## 2023-10-25 ENCOUNTER — Ambulatory Visit: Admitting: Physical Therapy

## 2023-10-25 ENCOUNTER — Ambulatory Visit: Admitting: Family Medicine

## 2023-10-25 VITALS — BP 108/78 | HR 90 | Ht 70.0 in | Wt 257.0 lb

## 2023-10-25 DIAGNOSIS — F902 Attention-deficit hyperactivity disorder, combined type: Secondary | ICD-10-CM

## 2023-10-25 DIAGNOSIS — M542 Cervicalgia: Secondary | ICD-10-CM | POA: Diagnosis not present

## 2023-10-25 DIAGNOSIS — R413 Other amnesia: Secondary | ICD-10-CM | POA: Diagnosis not present

## 2023-10-25 DIAGNOSIS — F0781 Postconcussional syndrome: Secondary | ICD-10-CM

## 2023-10-25 DIAGNOSIS — F5101 Primary insomnia: Secondary | ICD-10-CM | POA: Diagnosis not present

## 2023-10-25 DIAGNOSIS — R42 Dizziness and giddiness: Secondary | ICD-10-CM

## 2023-10-25 MED ORDER — METHYLPHENIDATE HCL ER 27 MG PO TB24: 27.0000 mg | ORAL_TABLET | Freq: Every day | ORAL | 0 refills | Status: DC

## 2023-10-25 NOTE — Patient Instructions (Addendum)
 Thank you for coming in today.   Try taking Belsomra  20mg  at night.   Try concerta  for ADHD symptoms.   See you back in 3 weeks.

## 2023-10-25 NOTE — Therapy (Signed)
 OUTPATIENT PHYSICAL THERAPY VESTIBULAR TREATMENT   Patient Name: Antonio Andrade MRN: [id number] DOB:June 11, 1950, 73 y.o., male Today's Date: 10/25/2023   PCP: Kip Righter, MD  REFERRING PROVIDER: Joane Artist RAMAN, MD  END OF SESSION:        Past Medical History:  Diagnosis Date   Acid reflux    Asthma    Atrial fibrillation (HCC)    Bipolar 1 disorder (HCC)    BPH (benign prostatic hyperplasia)    C. difficile colitis    Depression    Diabetes mellitus without complication (HCC)    Fatty liver    Fibromyalgia    Hyperlipidemia    Hypertension    Hypertensive retinopathy    Hypothyroidism    Insomnia    Low vitamin B12 level    Neuropathy    Obesity    Positive FIT (fecal immunochemical test)    Past Surgical History:  Procedure Laterality Date   CHOLECYSTECTOMY     Patient Active Problem List   Diagnosis Date Noted   Attention deficit hyperactivity disorder (ADHD), combined type 09/23/2023   Postconcussion syndrome 09/23/2023   Obesity 03/09/2018   Cellulitis 03/07/2018   Type 2 diabetes mellitus, controlled (HCC) 03/03/2018   Chronic diastolic CHF (congestive heart failure) (HCC) 03/03/2018   Cellulitis of left lower leg 03/03/2018   Weight gain 03/02/2018   Type 2 diabetes mellitus (HCC) 03/02/2018   Diabetic neuropathy (HCC) 03/02/2018   Hypothyroidism 03/02/2018   Chronic anemia 03/02/2018   HTN (hypertension) 03/02/2018   Depression 03/02/2018   Mood disorder (HCC) 03/02/2018   HLD (hyperlipidemia) 03/02/2018   GERD (gastroesophageal reflux disease) 03/02/2018   Asthma 03/02/2018   Muscle tension dysphonia 03/26/2013   Nasal polyp 03/26/2013   Vocal cord atrophy 03/26/2013    ONSET DATE: 07/09/23  REFERRING DIAG: M54.2 (ICD-10-CM) - Neck pain  THERAPY DIAG:  No diagnosis found.  Rationale for Evaluation and Treatment: Rehabilitation  SUBJECTIVE:                                                                                                                                                                                              SUBJECTIVE STATEMENT: Feeling okay.  Dizziness and lightheadedness is about 4/10.  Had to drive to [facility] and back yesterday and did okay with that.  No more falls. Have ordered a foot bike for home.   Pt accompanied by: self  PERTINENT HISTORY: Asthma, a-fib, bipolar disorder, depression, DM, fibromyalgia, HLD, HTN, neuropathy   PAIN:  Are you having pain? Yes: NPRS scale: 4-5/10 Pain location: right shoulder  and B UT Pain description: aching, shooting, tingling,  sharp Aggravating factors: pt unsure Relieving factors: chiropractor   PRECAUTIONS: Fall  RED FLAGS: None   WEIGHT BEARING RESTRICTIONS: No  FALLS: Has patient fallen in last 6 months? Yes. Number of falls 10  LIVING ENVIRONMENT: Lives with: lives alone Lives in: House/apartment Stairs: no Has following equipment at home: Single point cane, Environmental consultant - 4 wheeled, Tour manager, Grab bars, and None  PLOF: Independent and Vocation/Vocational requirements: retired; pt sits to do dishes and doesn't vacuum as much (reports this was the case at [facility] but he is more fatigued now)  PATIENT GOALS: improve dizziness  OBJECTIVE:     TODAY'S TREATMENT: 10/27/23 Activity Comments                           Dizzy rated as 4/10, lightheaded rated as 2/10 Does reports he continues to do rolling and Brandt-Daroff exercises, dizziness is lessening but not quickly  TODAY'S TREATMENT: 10/20/2023 Activity Comments  Vitals:128/84, HR 72 bpm   Side step R and L, 2 x 30 sec Forward/back walk, 2 x 30 sec  Incr. Dizziness posterior direction  Romberg + EC + head turns/nods   Partial tandem with EO/EC 30 sec Increased unsteadiness with EC  Forward/back stepping Very difficult with backwards direction  Forward stepping/return to midline 10 reps Back stepping/return to midline, 10 reps with hinge at hips UE support Rates 1-2 point  increase in dizziness  NuStep, Level 3, 4 extremities x 6 minutes       HOME EXERCISE PROGRAM Last updated: 10/04/23 Access Code: [facility] URL: [email or web address] Date: 10/04/2023 Prepared by: [facility] - Outpatient  Rehab - [facility]  Program Notes do not push into pain. stop neck stretches if pain lingers  Exercises - Brandt-Daroff Vestibular Exercise  - 1 x daily - 5 x weekly - 2 sets - 3-5 reps - Right Sidelying to Left Sidelying Vestibular Habituation  - 1 x daily - 5 x weekly - 2 sets - 3-5 reps - Supine to Right Sidelying Vestibular Habituation  - 1 x daily - 5 x weekly - 2 sets - 3-5 reps - Seated Cervical Rotation AROM  - 1 x daily - 5 x weekly - 3 sets - 5 reps - Seated Cervical Flexion AROM  - 1 x daily - 5 x weekly - 3 sets - 5 reps - Mid-Lower Cervical Extension SNAG with Strap  - 1 x daily - 5 x weekly - 2 sets - 10 reps - Feet Together Balance at [employer] Eyes Closed  - 1 x daily - 7 x weekly - 3 sets - 15 sec hold - Semi-Tandem Balance at [employer] Eyes Open  - 1 x daily - 7 x weekly - 3 sets - 15 sec hold - Supine Suboccipital Release with Tennis Balls  - 1 x daily - 7 x weekly - 3 sets - 10 reps    Note: Objective measures were completed at Evaluation unless otherwise noted.  DIAGNOSTIC FINDINGS: 08/23/23 cervical xray: mild-to-moderate degenerative joint changes of the mid to lower cervical spine  COGNITION: Overall cognitive status: Within functional limits for tasks assessed   SENSATION: Reports pain and numbness in feet and hands d/t neuropathy   COORDINATION: Alternating pronation/supination: WNL Alternating toe tap: WNL Finger to nose: slightly slowed R   POSTURE: rounded shoulders, forward head, and increased thoracic kyphosis  GAIT: Findings: Comments: slow, flexed trunk   PATIENT SURVEYS:  DHI 76/100   VESTIBULAR ASSESSMENT  GENERAL OBSERVATION: pt wears progressives   OCULOMOTOR EXAM: Ocular Alignment:  Cover/Uncover Test: WNL Cover/Cross Cover Test: WNL   Ocular ROM: limited R eye inferior ROM   Spontaneous Nystagmus: absent   Gaze-Induced Nystagmus: absent   Smooth Pursuits: intact   Saccades: 2 saccades vertically, 1 saccade R (normal for age)   Convergence/Divergence: 9.5 cm    VESTIBULAR - OCULAR REFLEX:    Slow VOR: Normal and Comment: c/o dizziness and neck stiffness B planes   VOR Cancellation: Normal   Head-Impulse Test: HIT Right: negative HIT Left: positive     POSITIONAL TESTING:  Right Roll Test: negative Left Roll Test: negative  Right Loaded Dix-Hallpike: limited by neck pain; R upbeating torsional nystagmus but pt asymptomatic Left Loaded Dix-Hallpike: NT                                                                                                                               TREATMENT DATE: 09/08/23    PATIENT EDUCATION: Education details: exam findings, prognosis, POC, edu on BPPV and plan for CRM next session Person educated: Patient Education method: Medical illustrator Education comprehension: verbalized understanding  HOME EXERCISE PROGRAM: Not initiated  GOALS: Goals reviewed with patient? Yes  SHORT TERM GOALS: Target date: 10/06/2023  Patient to be independent with initial HEP. Baseline: HEP initiated Goal status: IN PROGRESS    LONG TERM GOALS: Target date: 11/03/2023  Patient to be independent with advanced HEP. Baseline: Reviewed partial HEP with return demo 10/18/2023 Goal status: IN PROGRESS  Patient to report 0/10 dizziness with standing vertical and horizontal VOR for 30 seconds. Baseline: Unable Goal status: IN PROGRESS  Patient will report 0/10 dizziness with bed mobility.  Baseline: Symptomatic  Goal status: IN PROGRESS  Patient to demonstrate cervical AROM WFL and without pain limiting.  Baseline: see above Goal status: IN PROGRESS  Patient to score at least 19/24 on DGI in order to decrease risk of  falls. Baseline: 10/24 Goal status: IN PROGRESS  Patient to report no falls in the past 6 weeks.  Baseline: 7 falls since concussion  Goal status: IN PROGRESS  Patient to score at least 18 points less on DHI in order to meet MCID and improve functional outcomes.  Baseline: 76 Goal status: IN PROGRESS    ASSESSMENT:  CLINICAL IMPRESSION: Pt presents today with no new complaints; his dizziness, lightheadedness and shoulder pain are all slightly decreased compared to last visit. Skilled PT session focused on balance exercises with vision removed, with head motions and with some motion/balance strategy (forward/back stepping).  He has unsteadiness initially in posterior direction; with change to focus on hinge at hips with bilat UE support, posterior direction is more smooth.  He does report 1-2 point increase in dizziness with this backwards stepping motion, but it subsides quickly.   No complaints at end of session.  Pt will continue to benefit from skilled PT towards goals for improved functional mobility and  decreased fall risk.   OBJECTIVE IMPAIRMENTS: Abnormal gait, decreased activity tolerance, decreased balance, decreased coordination, decreased ROM, dizziness, impaired flexibility, postural dysfunction, and pain.   ACTIVITY LIMITATIONS: carrying, lifting, bending, sitting, standing, squatting, sleeping, stairs, transfers, bed mobility, bathing, toileting, dressing, reach over head, hygiene/grooming, locomotion level, and caring for others  PARTICIPATION LIMITATIONS: meal prep, cleaning, laundry, driving, shopping, community activity, and church  PERSONAL FACTORS: Age, Past/current experiences, Time since onset of injury/illness/exacerbation, and 3+ comorbidities: Asthma, a-fib, bipolar disorder, depression, DM, fibromyalgia, HLD, HTN, neuropathy  are also affecting patient's functional outcome.   REHAB POTENTIAL: Good  CLINICAL DECISION MAKING: Evolving/moderate  complexity  EVALUATION COMPLEXITY: Moderate  PLAN:  PT FREQUENCY: 2x/week  PT DURATION: 8 weeks  PLANNED INTERVENTIONS: 97164- PT Re-evaluation, 97750- Physical Performance Testing, 97110-Therapeutic exercises, 97530- Therapeutic activity, [id number]- Neuromuscular re-education, 97535- Self Care, 02859- Manual therapy, [phone]- Gait training, [id number]- Canalith repositioning, [id number]- Aquatic Therapy, [id number]- Electrical stimulation (unattended), Patient/Family education, Balance training, Stair training, Taping, Dry Needling, Joint mobilization, Spinal mobilization, Vestibular training, Cryotherapy, and Moist heat  PLAN FOR NEXT SESSION:  Continue balance exercises-step strategy; motion sensitivity and cervical ROM
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
## 2023-10-27 ENCOUNTER — Ambulatory Visit: Admitting: Physical Therapy

## 2023-10-27 ENCOUNTER — Telehealth: Payer: Self-pay | Admitting: Physical Therapy

## 2023-10-27 ENCOUNTER — Telehealth: Payer: Self-pay

## 2023-10-27 ENCOUNTER — Encounter: Payer: Self-pay | Admitting: Physical Therapy

## 2023-10-27 DIAGNOSIS — M542 Cervicalgia: Secondary | ICD-10-CM

## 2023-10-27 DIAGNOSIS — H8112 Benign paroxysmal vertigo, left ear: Secondary | ICD-10-CM | POA: Diagnosis not present

## 2023-10-27 DIAGNOSIS — R42 Dizziness and giddiness: Secondary | ICD-10-CM

## 2023-10-27 DIAGNOSIS — R2681 Unsteadiness on feet: Secondary | ICD-10-CM

## 2023-10-27 NOTE — Telephone Encounter (Signed)
 Methylphenidate  ER 27 mg tab 1 tab po every day  Note from pharmacy:  Alternative requested: per insurance not covered [facility] [address]  Forwarding to Dr. Joane.

## 2023-10-27 NOTE — Telephone Encounter (Signed)
 Hi Dr. Kip,   I am seeing Antonio Andrade in OPPT s/p concussion per referral from Dr. Joane. The patient reports episodes of blacking out which last a split second and he continues to have falls. Please see orthostatic measures taken on 10/04/23 as they may be contributing to his symptoms. He may benefit from a f/u with you to discuss.     Thanks,   Louana Terrilyn Christians, PT, DPT 10/27/23 10:14 AM  [facility] [address] [place], [address] [place], [place] [address] Phone # [phone] Fax # [phone]              Orthostatic Testing    Supine Sitting Standing  x1 Minute Standing x 3 Minutes  BP 141/92 mmHg 120/84 109/70 110/72  HR 81 bpm 87 *dizziness  96 96

## 2023-10-31 NOTE — Telephone Encounter (Signed)
 Can we please research some alternatives?  Am not sure why this is not covered it is a generic ADHD medication.  He has ADHD.

## 2023-10-31 NOTE — Telephone Encounter (Signed)
 Prior Authorization initiated for Methylphenidate  via [email or web address] KEY: [id number]

## 2023-10-31 NOTE — Telephone Encounter (Signed)
 Possible preferred alternatives:

## 2023-11-01 ENCOUNTER — Ambulatory Visit

## 2023-11-01 NOTE — Telephone Encounter (Signed)
 Request for additional information form completed and faxed back.

## 2023-11-01 NOTE — Telephone Encounter (Signed)
  [employer] called with follow up questions in regards to the prior auth.  Call Back #[phone] REF # [id number]

## 2023-11-01 NOTE — Telephone Encounter (Signed)
 Pt must try and fail formulary alternatives: Generic Adderall XR Atomoxetine Clonidine ER Dexmethylphenidate  ER Guanfacine ER

## 2023-11-02 NOTE — Telephone Encounter (Signed)
 Prior Authorization DENIED  Decision Notes: METHYLPHENID TAB 27MG  ER is denied because it is not on your plan's Drug List (formulary). Medication authorization requires the following: (1) You need to try two (2) of these covered drugs: (a) Amphetamine-dextroamphetamine ER capsule (generic Adderall XR). (b) Atomoxetine. (d) Dexmethylphenidate  ER. (2) OR your doctor needs to give us  specific medical reasons why two (2) of the covered drug(s) are not appropriate for you. Reviewed by: Jolaine. *Please note: Covered drug(s) may require prior authorization.

## 2023-11-03 ENCOUNTER — Ambulatory Visit

## 2023-11-03 DIAGNOSIS — M542 Cervicalgia: Secondary | ICD-10-CM

## 2023-11-03 DIAGNOSIS — R42 Dizziness and giddiness: Secondary | ICD-10-CM | POA: Diagnosis not present

## 2023-11-03 DIAGNOSIS — H8112 Benign paroxysmal vertigo, left ear: Secondary | ICD-10-CM | POA: Diagnosis not present

## 2023-11-03 DIAGNOSIS — R2681 Unsteadiness on feet: Secondary | ICD-10-CM | POA: Diagnosis not present

## 2023-11-03 MED ORDER — DEXMETHYLPHENIDATE HCL ER 15 MG PO CP24
15.0000 mg | ORAL_CAPSULE | Freq: Every day | ORAL | 0 refills | Status: AC
Start: 2023-11-03 — End: ?

## 2023-11-03 NOTE — Addendum Note (Signed)
 Addended by: JOANE ARTIST RAMAN on: 11/03/2023 06:48 AM   Modules accepted: Orders

## 2023-11-03 NOTE — Therapy (Signed)
 OUTPATIENT PHYSICAL THERAPY VESTIBULAR TREATMENT, Progress Note, and Recertification   Patient Name: Antonio Andrade MRN: [id number] DOB:October 22, 1950, 73 y.o., male Today's Date: 11/03/2023   PCP: Kip Righter, MD  REFERRING PROVIDER: Joane Artist RAMAN, MD  Progress Note Reporting Period 09/08/23 to 11/03/23  See note below for Objective Data and Assessment of Progress/Goals.      END OF SESSION:  PT End of Session - 11/03/23 0930     Visit Number 9    Number of Visits 21    Date for PT Re-Evaluation 12/15/23    Authorization Type [employer] Medicare    Authorization Time Period auth submitted 7/17    Authorization - Visit Number 8    Authorization - Number of Visits 17    Progress Note Due on Visit 19    PT Start Time 0930    PT Stop Time 1015    PT Time Calculation (min) 45 min    Equipment Utilized During Treatment Gait belt    Activity Tolerance Patient tolerated treatment well   lightheadedness   Behavior During Therapy WFL for tasks assessed/performed               Past Medical History:  Diagnosis Date   Acid reflux    Asthma    Atrial fibrillation (HCC)    Bipolar 1 disorder (HCC)    BPH (benign prostatic hyperplasia)    C. difficile colitis    Depression    Diabetes mellitus without complication (HCC)    Fatty liver    Fibromyalgia    Hyperlipidemia    Hypertension    Hypertensive retinopathy    Hypothyroidism    Insomnia    Low vitamin B12 level    Neuropathy    Obesity    Positive FIT (fecal immunochemical test)    Past Surgical History:  Procedure Laterality Date   CHOLECYSTECTOMY     Patient Active Problem List   Diagnosis Date Noted   Attention deficit hyperactivity disorder (ADHD), combined type 09/23/2023   Postconcussion syndrome 09/23/2023   Obesity 03/09/2018   Cellulitis 03/07/2018   Type 2 diabetes mellitus, controlled (HCC) 03/03/2018   Chronic diastolic CHF (congestive heart failure) (HCC) 03/03/2018   Cellulitis of left lower leg  03/03/2018   Weight gain 03/02/2018   Type 2 diabetes mellitus (HCC) 03/02/2018   Diabetic neuropathy (HCC) 03/02/2018   Hypothyroidism 03/02/2018   Chronic anemia 03/02/2018   HTN (hypertension) 03/02/2018   Depression 03/02/2018   Mood disorder (HCC) 03/02/2018   HLD (hyperlipidemia) 03/02/2018   GERD (gastroesophageal reflux disease) 03/02/2018   Asthma 03/02/2018   Muscle tension dysphonia 03/26/2013   Nasal polyp 03/26/2013   Vocal cord atrophy 03/26/2013    ONSET DATE: 07/09/23  REFERRING DIAG: M54.2 (ICD-10-CM) - Neck pain  THERAPY DIAG:  Dizziness and giddiness  Unsteadiness on feet  BPPV (benign paroxysmal positional vertigo), left  Cervicalgia  Rationale for Evaluation and Treatment: Rehabilitation  SUBJECTIVE:  SUBJECTIVE STATEMENT: Having symptoms of fogginess mostly this AM. The neck is still sore from the last fall   Pt accompanied by: self  PERTINENT HISTORY: Asthma, a-fib, bipolar disorder, depression, DM, fibromyalgia, HLD, HTN, neuropathy   PAIN:  Are you having pain? Yes: NPRS scale: 4/10 Pain location: neck pain Pain description: aching, shooting, tingling, sharp Aggravating factors: pt unsure Relieving factors: chiropractor   PRECAUTIONS: Fall  RED FLAGS: None   WEIGHT BEARING RESTRICTIONS: No  FALLS: Has patient fallen in last 6 months? Yes. Number of falls 10  LIVING ENVIRONMENT: Lives with: lives alone Lives in: House/apartment Stairs: no Has following equipment at home: Single point cane, Environmental consultant - 4 wheeled, Tour manager, Grab bars, and None  PLOF: Independent and Vocation/Vocational requirements: retired; pt sits to do dishes and doesn't vacuum as much (reports this was the case at [facility] but he is more fatigued now)  PATIENT GOALS: improve  dizziness  OBJECTIVE:   TODAY'S TREATMENT: 11/03/23 Activity Comments  Vitals sitting 146/83, 53 bpm  DHI 68/100  Baseline symptoms sitting 3/10 foggniess   Bed mobility: Sit to supine: 0/10 symptoms Rolling right: 0/10 Rolling left: 1/10 dizzy, quickly settles Supine to sit: 2/10 dizzy   Seated VOR x 1 x 30 sec Horizontal: 0/10 dizzy, 0/10 nausea, HA 0/10, 0/10 fogginess Vertical: 0.5/10 dizzy, 0/10 all others  Cervical spine ROM Flexion: 45 Extension: 30 Right lat flex: 20 Left lat flex: 10 (right UT pain) Right rot: 30 (pain) Left rot: 35   DGI 15/24       TODAY'S TREATMENT: 10/27/23 Activity Comments  Alt backwards steps  At counter; cueing for wider BOS and rocking onto back foot. Improved stability after practice  1/4 and 1/2 turns to targets between counter and chair Cueing for larger more efficient steps; occasional UE support required   Standing bending to pick up bean bags; 1 set using counter for support, 1 set using cane Reported mild lightheadedness on 2/10 reps. CGA for safety   Pt c/o dizziness upon standing up from sitting    alt toe tap to cone 1 UE support on counter  Pt c/o dizziness upon standing up from sitting       PATIENT EDUCATION: Education details: HEP update with detailed edu on safety; edu on pt's orthostatics Person educated: Patient Education method: Explanation, Demonstration, Tactile cues, Verbal cues, and Handouts Education comprehension: verbalized understanding and returned demonstration   HOME EXERCISE PROGRAM Access Code: [facility] URL: [email or web address] Date: 10/27/2023 Prepared by: [facility] Outpatient  Rehab - [facility]  Program Notes perform exercises at counter with chair nearby for safety  Exercises - Feet Together Balance at Counter Top Eyes Closed  - 1 x daily - 7 x weekly - 3 sets - 15 sec hold - Semi-Tandem Balance at [employer] Eyes Open  - 1 x daily - 7 x weekly - 3 sets - 15 sec  hold - Alternating Step Backward with Support  - 1 x daily - 5 x weekly - 2 sets - 10 reps - Standing Quarter Turn with Counter Support  - 1 x daily - 5 x weekly - 2 sets - 10 reps    Note: Objective measures were completed at Evaluation unless otherwise noted.  DIAGNOSTIC FINDINGS: 08/23/23 cervical xray: mild-to-moderate degenerative joint changes of the mid to lower cervical spine  COGNITION: Overall cognitive status: Within functional limits for tasks assessed   SENSATION: Reports pain and numbness in feet and hands d/t neuropathy  COORDINATION: Alternating pronation/supination: WNL Alternating toe tap: WNL Finger to nose: slightly slowed R   POSTURE: rounded shoulders, forward head, and increased thoracic kyphosis  GAIT: Findings: Comments: slow, flexed trunk   PATIENT SURVEYS:  DHI 76/100   VESTIBULAR ASSESSMENT   GENERAL OBSERVATION: pt wears progressives   OCULOMOTOR EXAM: Ocular Alignment: Cover/Uncover Test: WNL Cover/Cross Cover Test: WNL   Ocular ROM: limited R eye inferior ROM   Spontaneous Nystagmus: absent   Gaze-Induced Nystagmus: absent   Smooth Pursuits: intact   Saccades: 2 saccades vertically, 1 saccade R (normal for age)   Convergence/Divergence: 9.5 cm    VESTIBULAR - OCULAR REFLEX:    Slow VOR: Normal and Comment: c/o dizziness and neck stiffness B planes   VOR Cancellation: Normal   Head-Impulse Test: HIT Right: negative HIT Left: positive     POSITIONAL TESTING:  Right Roll Test: negative Left Roll Test: negative  Right Loaded Dix-Hallpike: limited by neck pain; R upbeating torsional nystagmus but pt asymptomatic Left Loaded Dix-Hallpike: NT                                                                                                                               TREATMENT DATE: 09/08/23    PATIENT EDUCATION: Education details: exam findings, prognosis, POC, edu on BPPV and plan for CRM next session Person educated:  Patient Education method: Medical illustrator Education comprehension: verbalized understanding  HOME EXERCISE PROGRAM: Not initiated  GOALS: Goals reviewed with patient? Yes  SHORT TERM GOALS: Target date: 10/06/2023  Patient to be independent with initial HEP. Baseline: HEP initiated Goal status: IN PROGRESS    LONG TERM GOALS: Target date: 12/15/2023    Patient to be independent with advanced HEP. Baseline: Reviewed partial HEP with return demo 10/18/2023 Goal status: IN PROGRESS  Patient to report 0/10 dizziness with standing vertical and horizontal VOR for 30 seconds. Baseline: Unable; 0/10 Goal status: MET  Patient will report 0/10 dizziness with bed mobility.  Baseline: Symptomatic; 0-2/10 Goal status: IN PROGRESS 11/03/23  Patient to demonstrate cervical AROM WFL and without pain limiting.  Baseline: see above Goal status: IN PROGRESS  Patient to score at least 19/24 on DGI in order to decrease risk of falls. Baseline: 10/24; 15/24 Goal status: IN PROGRESS 11/03/23  Patient to report no falls in the past 6 weeks.  Baseline: 7 falls since concussion; 2 falls Goal status: IN PROGRESS 11/03/23  Patient to score at least 18 points less on DHI in order to meet MCID and improve functional outcomes.  Baseline: 76; 68 Goal status: IN PROGRESS 11/03/23    ASSESSMENT:  CLINICAL IMPRESSION: Reports overall improved symptoms since start of care with dizziness/lightheadedness/fogginess being the prevailing symptoms.  Notes ongoing blood pressure fluctuations and lightheaded with supine to sit or sit to stand.  Self-report of symptoms improved from baseline 76% DHI to current 68%. Cervical ROM remains limited with pain right cervical column/upper trap which was exacerbated  from recent fall and striking head on wall.  Dynamic Gait Index improved from 10/24 to 15/24 indicating improved mobility/balance. Pt has had interaction of conditions limiting progress and  requiring increased time for recovery.  Continued sessions would be benficial to progress POC details to improve mobility and reduce risk for falls.   OBJECTIVE IMPAIRMENTS: Abnormal gait, decreased activity tolerance, decreased balance, decreased coordination, decreased ROM, dizziness, impaired flexibility, postural dysfunction, and pain.   ACTIVITY LIMITATIONS: carrying, lifting, bending, sitting, standing, squatting, sleeping, stairs, transfers, bed mobility, bathing, toileting, dressing, reach over head, hygiene/grooming, locomotion level, and caring for others  PARTICIPATION LIMITATIONS: meal prep, cleaning, laundry, driving, shopping, community activity, and church  PERSONAL FACTORS: Age, Past/current experiences, Time since onset of injury/illness/exacerbation, and 3+ comorbidities: Asthma, a-fib, bipolar disorder, depression, DM, fibromyalgia, HLD, HTN, neuropathy  are also affecting patient's functional outcome.   REHAB POTENTIAL: Good  CLINICAL DECISION MAKING: Evolving/moderate complexity  EVALUATION COMPLEXITY: Moderate  PLAN:  PT FREQUENCY: 1-2x/week  PT DURATION: 6 weeks  PLANNED INTERVENTIONS: 97164- PT Re-evaluation, 97750- Physical Performance Testing, 97110-Therapeutic exercises, 97530- Therapeutic activity, [id number]- Neuromuscular re-education, 97535- Self Care, [id number]- Manual therapy, [phone]- Gait training, [phone]- Canalith repositioning, [id number]- Aquatic Therapy, [phone]- Electrical stimulation (unattended), Patient/Family education, Balance training, Stair training, Taping, Dry Needling, Joint mobilization, Spinal mobilization, Vestibular training, Cryotherapy, and Moist heat  PLAN FOR NEXT SESSION:  f/u on orthostatics- has he seen PCP? Continue balance exercises-step strategy; motion sensitivity and cervical ROM   10:21 AM, 11/03/23 M. Kelly Raydon Chappuis, PT, DPT Physical Therapist- [employer] Office Number: [phone]

## 2023-11-03 NOTE — Telephone Encounter (Signed)
 Methylphenidate  switched to a very similar compound dexmethylphenidate  which is on your preferred medication list.

## 2023-11-07 NOTE — Therapy (Signed)
 OUTPATIENT PHYSICAL THERAPY VESTIBULAR TREATMENT   Patient Name: Antonio Andrade MRN: [id number] DOB:[birth year], 73 y.o., male Today's Date: 11/08/2023   PCP: Kip Righter, MD  REFERRING PROVIDER: Joane Artist RAMAN, MD     END OF SESSION:  PT End of Session - 11/08/23 1011     Visit Number 10    Number of Visits 21    Date for PT Re-Evaluation 12/15/23    Authorization Type [employer] Medicare    Authorization Time Period Approved Service or Care: 6 Therapy Visit(s) from 11/04/2023 to 12/02/2023    Authorization - Visit Number 1    Authorization - Number of Visits 6    Progress Note Due on Visit 19    PT Start Time 0942   pt late   PT Stop Time 1018    PT Time Calculation (min) 36 min    Equipment Utilized During Treatment Gait belt    Activity Tolerance Patient tolerated treatment well   lightheadedness   Behavior During Therapy WFL for tasks assessed/performed                Past Medical History:  Diagnosis Date   Acid reflux    Asthma    Atrial fibrillation (HCC)    Bipolar 1 disorder (HCC)    BPH (benign prostatic hyperplasia)    C. difficile colitis    Depression    Diabetes mellitus without complication (HCC)    Fatty liver    Fibromyalgia    Hyperlipidemia    Hypertension    Hypertensive retinopathy    Hypothyroidism    Insomnia    Low vitamin B12 level    Neuropathy    Obesity    Positive FIT (fecal immunochemical test)    Past Surgical History:  Procedure Laterality Date   CHOLECYSTECTOMY     Patient Active Problem List   Diagnosis Date Noted   Attention deficit hyperactivity disorder (ADHD), combined type 09/23/2023   Postconcussion syndrome 09/23/2023   Obesity 03/09/2018   Cellulitis 03/07/2018   Type 2 diabetes mellitus, controlled (HCC) 03/03/2018   Chronic diastolic CHF (congestive heart failure) (HCC) 03/03/2018   Cellulitis of left lower leg 03/03/2018   Weight gain 03/02/2018   Type 2 diabetes mellitus (HCC) 03/02/2018   Diabetic  neuropathy (HCC) 03/02/2018   Hypothyroidism 03/02/2018   Chronic anemia 03/02/2018   HTN (hypertension) 03/02/2018   Depression 03/02/2018   Mood disorder (HCC) 03/02/2018   HLD (hyperlipidemia) 03/02/2018   GERD (gastroesophageal reflux disease) 03/02/2018   Asthma 03/02/2018   Muscle tension dysphonia 03/26/2013   Nasal polyp 03/26/2013   Vocal cord atrophy 03/26/2013    ONSET DATE: 07/09/23  REFERRING DIAG: M54.2 (ICD-10-CM) - Neck pain  THERAPY DIAG:  Dizziness and giddiness  Unsteadiness on feet  BPPV (benign paroxysmal positional vertigo), left  Cervicalgia  Rationale for Evaluation and Treatment: Rehabilitation  SUBJECTIVE:  SUBJECTIVE STATEMENT: I got up early and got behind. Reports that he has been feeling a little better as far as dizziness and neck pain. Pt reports that he has not been contacted by PCP's office about orthostasis.   Pt accompanied by: self  PERTINENT HISTORY: Asthma, a-fib, bipolar disorder, depression, DM, fibromyalgia, HLD, HTN, neuropathy   PAIN:  Are you having pain? Yes: NPRS scale: 0/10, other than my fingers, they are killing me Pain location: neck pain Pain description: aching, shooting, tingling, sharp Aggravating factors: pt unsure Relieving factors: chiropractor   PRECAUTIONS: Fall  RED FLAGS: None   WEIGHT BEARING RESTRICTIONS: No  FALLS: Has patient fallen in last 6 months? Yes. Number of falls 10  LIVING ENVIRONMENT: Lives with: lives alone Lives in: House/apartment Stairs: no Has following equipment at home: Single point cane, Environmental consultant - 4 wheeled, Tour manager, Grab bars, and None  PLOF: Independent and Vocation/Vocational requirements: retired; pt sits to do dishes and doesn't vacuum as much (reports this was the case at [facility] but  he is more fatigued now)  PATIENT GOALS: improve dizziness  OBJECTIVE:     TODAY'S TREATMENT: 11/08/23 Activity Comments  Ankle pumps before transfers to reduce sx   standing head nods to targets 2x30 3/10 dizziness; good stability   standing head turns to targets 2x30 5-6/10 dizziness; mild instability. Cues to reduce speed to improve tolerance    1/2 turns to targets in doorway 6-7/10 dizziness with rest breaks in between.    Allowed symptoms to settle in standing, leaning against a wall before pt left           PATIENT EDUCATION: Education details: encouragement pt to reach out to PCP's office to get appointment scheduled to discuss orthostasis; reviewed self-management techniques for orthostasis; provided handout of edu covered today (see pt instructions); discussed use of 4WW as pt reports he only uses it in the home- edu on benefits of using this AD outside d/t frequent falls and difficulty transferring from the floor, POC Person educated: Patient Education method: Explanation, Demonstration, Tactile cues, Verbal cues, and Handouts Education comprehension: verbalized understanding and returned demonstration    HOME EXERCISE PROGRAM Access Code: [facility] URL: [email or web address] Date: 10/27/2023 Prepared by: [facility] - Outpatient  Rehab - [facility]  Program Notes perform exercises at counter with chair nearby for safety  Exercises - Feet Together Balance at Counter Top Eyes Closed  - 1 x daily - 7 x weekly - 3 sets - 15 sec hold - Semi-Tandem Balance at [employer] Eyes Open  - 1 x daily - 7 x weekly - 3 sets - 15 sec hold - Alternating Step Backward with Support  - 1 x daily - 5 x weekly - 2 sets - 10 reps - Standing Quarter Turn with Counter Support  - 1 x daily - 5 x weekly - 2 sets - 10 reps    Note: Objective measures were completed at Evaluation unless otherwise noted.  DIAGNOSTIC FINDINGS: 08/23/23 cervical xray: mild-to-moderate  degenerative joint changes of the mid to lower cervical spine  COGNITION: Overall cognitive status: Within functional limits for tasks assessed   SENSATION: Reports pain and numbness in feet and hands d/t neuropathy   COORDINATION: Alternating pronation/supination: WNL Alternating toe tap: WNL Finger to nose: slightly slowed R   POSTURE: rounded shoulders, forward head, and increased thoracic kyphosis  GAIT: Findings: Comments: slow, flexed trunk   PATIENT SURVEYS:  DHI 76/100   VESTIBULAR ASSESSMENT   GENERAL OBSERVATION: pt  wears progressives   OCULOMOTOR EXAM: Ocular Alignment: Cover/Uncover Test: WNL Cover/Cross Cover Test: WNL   Ocular ROM: limited R eye inferior ROM   Spontaneous Nystagmus: absent   Gaze-Induced Nystagmus: absent   Smooth Pursuits: intact   Saccades: 2 saccades vertically, 1 saccade R (normal for age)   Convergence/Divergence: 9.5 cm    VESTIBULAR - OCULAR REFLEX:    Slow VOR: Normal and Comment: c/o dizziness and neck stiffness B planes   VOR Cancellation: Normal   Head-Impulse Test: HIT Right: negative HIT Left: positive     POSITIONAL TESTING:  Right Roll Test: negative Left Roll Test: negative  Right Loaded Dix-Hallpike: limited by neck pain; R upbeating torsional nystagmus but pt asymptomatic Left Loaded Dix-Hallpike: NT                                                                                                                               TREATMENT DATE: 09/08/23    PATIENT EDUCATION: Education details: exam findings, prognosis, POC, edu on BPPV and plan for CRM next session Person educated: Patient Education method: Medical illustrator Education comprehension: verbalized understanding  HOME EXERCISE PROGRAM: Not initiated  GOALS: Goals reviewed with patient? Yes  SHORT TERM GOALS: Target date: 10/06/2023  Patient to be independent with initial HEP. Baseline: HEP initiated Goal status: MET    LONG  TERM GOALS: Target date: 12/15/2023    Patient to be independent with advanced HEP. Baseline: Reviewed partial HEP with return demo 10/18/2023 Goal status: IN PROGRESS  Patient to report 0/10 dizziness with standing vertical and horizontal VOR for 30 seconds. Baseline: Unable; 0/10 Goal status: MET  Patient will report 0/10 dizziness with bed mobility.  Baseline: Symptomatic; 0-2/10 Goal status: IN PROGRESS 11/03/23  Patient to demonstrate cervical AROM WFL and without pain limiting.  Baseline: see above Goal status: IN PROGRESS  Patient to score at least 19/24 on DGI in order to decrease risk of falls. Baseline: 10/24; 15/24 Goal status: IN PROGRESS 11/03/23  Patient to report no falls in the past 6 weeks.  Baseline: 7 falls since concussion; 2 falls Goal status: IN PROGRESS 11/03/23  Patient to score at least 18 points less on DHI in order to meet MCID and improve functional outcomes.  Baseline: 76; 68 Goal status: IN PROGRESS 11/03/23    ASSESSMENT:  CLINICAL IMPRESSION: Patient arrived to session with report of reduced symptoms for the last couple of days. Provided edu on self-management of orthostasis as patient reports that he has not heard from his PCP concerning fluctuating BP values. Proceeded with standing head movement habituation and balance activities. Patient reported mild-moderate dizziness with head movements and cues were provided to improve tolerance. Patient requires cueing to perform ankle pumps before transfers throughout session. Dizziness was exacerbated most with turns today; allowed symptoms to resolve in standing "recovery position" in order to avoid setting off orthostatic response. Patient reported feeling better by end of session.   OBJECTIVE  IMPAIRMENTS: Abnormal gait, decreased activity tolerance, decreased balance, decreased coordination, decreased ROM, dizziness, impaired flexibility, postural dysfunction, and pain.   ACTIVITY LIMITATIONS: carrying,  lifting, bending, sitting, standing, squatting, sleeping, stairs, transfers, bed mobility, bathing, toileting, dressing, reach over head, hygiene/grooming, locomotion level, and caring for others  PARTICIPATION LIMITATIONS: meal prep, cleaning, laundry, driving, shopping, community activity, and church  PERSONAL FACTORS: Age, Past/current experiences, Time since onset of injury/illness/exacerbation, and 3+ comorbidities: Asthma, a-fib, bipolar disorder, depression, DM, fibromyalgia, HLD, HTN, neuropathy  are also affecting patient's functional outcome.   REHAB POTENTIAL: Good  CLINICAL DECISION MAKING: Evolving/moderate complexity  EVALUATION COMPLEXITY: Moderate  PLAN:  PT FREQUENCY: 1-2x/week  PT DURATION: 6 weeks  PLANNED INTERVENTIONS: 97164- PT Re-evaluation, 97750- Physical Performance Testing, 97110-Therapeutic exercises, 97530- Therapeutic activity, [id number]- Neuromuscular re-education, 97535- Self Care, [id number]- Manual therapy, [phone]- Gait training, [phone]- Canalith repositioning, [id number]- Aquatic Therapy, [phone]- Electrical stimulation (unattended), Patient/Family education, Balance training, Stair training, Taping, Dry Needling, Joint mobilization, Spinal mobilization, Vestibular training, Cryotherapy, and Moist heat  PLAN FOR NEXT SESSION:  f/u on orthostatics- has he seen PCP? Continue to remind him to perform ankle pumps before transfers; Continue balance exercises-step strategy; motion sensitivity and cervical ROM   Louana Terrilyn Christians, PT, DPT 11/08/23 11:11 AM  [facility] at [facility] [address] [place], [address] [place], [place] [address] Phone # [phone] Fax # [phone]

## 2023-11-08 ENCOUNTER — Ambulatory Visit: Admitting: Physical Therapy

## 2023-11-08 ENCOUNTER — Encounter: Payer: Self-pay | Admitting: Family Medicine

## 2023-11-08 ENCOUNTER — Telehealth: Payer: Self-pay

## 2023-11-08 ENCOUNTER — Encounter: Admitting: Family Medicine

## 2023-11-08 ENCOUNTER — Encounter: Payer: Self-pay | Admitting: Physical Therapy

## 2023-11-08 ENCOUNTER — Ambulatory Visit: Admitting: Family Medicine

## 2023-11-08 VITALS — BP 106/74 | HR 62 | Ht 70.0 in

## 2023-11-08 DIAGNOSIS — R2681 Unsteadiness on feet: Secondary | ICD-10-CM

## 2023-11-08 DIAGNOSIS — H8112 Benign paroxysmal vertigo, left ear: Secondary | ICD-10-CM | POA: Diagnosis not present

## 2023-11-08 DIAGNOSIS — F0781 Postconcussional syndrome: Secondary | ICD-10-CM

## 2023-11-08 DIAGNOSIS — M542 Cervicalgia: Secondary | ICD-10-CM | POA: Diagnosis not present

## 2023-11-08 DIAGNOSIS — R42 Dizziness and giddiness: Secondary | ICD-10-CM | POA: Diagnosis not present

## 2023-11-08 DIAGNOSIS — F902 Attention-deficit hyperactivity disorder, combined type: Secondary | ICD-10-CM

## 2023-11-08 NOTE — Telephone Encounter (Signed)
 Prior Authorization initiated for DEXMETHYLPHENIDATE  ER  via [email or web address] KEY: [id number]   Information regarding your request This medication or product is on your plan's list of covered drugs. Prior authorization is not required at this time. If your pharmacy has questions regarding the processing of your prescription, please have them call the [facility] pharmacy help desk at [phone]. **Please note: This request was submitted electronically. Formulary lowering, tiering exception, cost reduction and/or pre-benefit determination review (including prospective Medicare hospice reviews) requests cannot be requested using this method of submission. Providers contact us  at [phone] for further assistance.
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
## 2023-11-08 NOTE — Patient Instructions (Signed)
 Thank you for coming in today.   We will investigate Dexmethlyphenidate. Let us  know if you do not hear anything in the next week.   Backup plan for Adderall XR.   Continue PT and home exercises.   Recheck in 8 weeks.

## 2023-11-08 NOTE — Patient Instructions (Signed)
 To manage low blood pressure: Increase hydration Wear compression socks Ankle pumps   Reach out to Dr. Lanney office and see if you can schedule an appointment to talk about fluctuating blood pressure

## 2023-11-08 NOTE — Telephone Encounter (Signed)
 Called [facility], advised that RX was put on file as too soon to fill. I let them know that this is a new RX, they will fill now for pt.   Called pt, left VM advising that pharmacy is working on [employer].

## 2023-11-10 ENCOUNTER — Ambulatory Visit: Admitting: Physical Therapy

## 2023-11-15 ENCOUNTER — Ambulatory Visit: Admitting: Physical Therapy

## 2023-11-15 ENCOUNTER — Encounter: Payer: Self-pay | Admitting: Physical Therapy

## 2023-11-15 DIAGNOSIS — R42 Dizziness and giddiness: Secondary | ICD-10-CM

## 2023-11-15 DIAGNOSIS — R2681 Unsteadiness on feet: Secondary | ICD-10-CM

## 2023-11-15 DIAGNOSIS — H8112 Benign paroxysmal vertigo, left ear: Secondary | ICD-10-CM

## 2023-11-15 DIAGNOSIS — M542 Cervicalgia: Secondary | ICD-10-CM

## 2023-11-15 NOTE — Therapy (Signed)
 OUTPATIENT PHYSICAL THERAPY VESTIBULAR TREATMENT   Patient Name: Antonio Andrade MRN: [id number] DOB:[birth year], 73 y.o., male Today's Date: 11/15/2023   PCP: Kip Righter, MD  REFERRING PROVIDER: Joane Artist RAMAN, MD     END OF SESSION:  PT End of Session - 11/15/23 0846     Visit Number 11    Number of Visits 21    Date for PT Re-Evaluation 12/15/23    Authorization Type [employer] Medicare    Authorization Time Period Approved Service or Care: 6 Therapy Visit(s) from 11/04/2023 to 12/02/2023    Authorization - Number of Visits 6    Progress Note Due on Visit 19    PT Start Time 0846    PT Stop Time 0925    PT Time Calculation (min) 39 min    Equipment Utilized During Treatment Gait belt    Activity Tolerance Patient tolerated treatment well   lightheadedness   Behavior During Therapy WFL for tasks assessed/performed          Past Medical History:  Diagnosis Date   Acid reflux    Asthma    Atrial fibrillation (HCC)    Bipolar 1 disorder (HCC)    BPH (benign prostatic hyperplasia)    C. difficile colitis    Depression    Diabetes mellitus without complication (HCC)    Fatty liver    Fibromyalgia    Hyperlipidemia    Hypertension    Hypertensive retinopathy    Hypothyroidism    Insomnia    Low vitamin B12 level    Neuropathy    Obesity    Positive FIT (fecal immunochemical test)    Past Surgical History:  Procedure Laterality Date   CHOLECYSTECTOMY     Patient Active Problem List   Diagnosis Date Noted   Attention deficit hyperactivity disorder (ADHD), combined type 09/23/2023   Postconcussion syndrome 09/23/2023   Obesity 03/09/2018   Cellulitis 03/07/2018   Type 2 diabetes mellitus, controlled (HCC) 03/03/2018   Chronic diastolic CHF (congestive heart failure) (HCC) 03/03/2018   Cellulitis of left lower leg 03/03/2018   Weight gain 03/02/2018   Type 2 diabetes mellitus (HCC) 03/02/2018   Diabetic neuropathy (HCC) 03/02/2018   Hypothyroidism 03/02/2018    Chronic anemia 03/02/2018   HTN (hypertension) 03/02/2018   Depression 03/02/2018   Mood disorder (HCC) 03/02/2018   HLD (hyperlipidemia) 03/02/2018   GERD (gastroesophageal reflux disease) 03/02/2018   Asthma 03/02/2018   Muscle tension dysphonia 03/26/2013   Nasal polyp 03/26/2013   Vocal cord atrophy 03/26/2013    ONSET DATE: 07/09/23  REFERRING DIAG: M54.2 (ICD-10-CM) - Neck pain  THERAPY DIAG:  Dizziness and giddiness  Unsteadiness on feet  BPPV (benign paroxysmal positional vertigo), left  Cervicalgia  Rationale for Evaluation and Treatment: Rehabilitation  SUBJECTIVE:  SUBJECTIVE STATEMENT: Pt states he has an appointment with his PCP tomorrow. Rates dizziness as a 7/10 this morning.   Pt accompanied by: self  PERTINENT HISTORY: Asthma, a-fib, bipolar disorder, depression, DM, fibromyalgia, HLD, HTN, neuropathy   PAIN:  Are you having pain? Yes: NPRS scale: 0/10, other than my fingers, they are killing me Pain location: neck pain Pain description: aching, shooting, tingling, sharp Aggravating factors: pt unsure Relieving factors: chiropractor   PRECAUTIONS: Fall  RED FLAGS: None   WEIGHT BEARING RESTRICTIONS: No  FALLS: Has patient fallen in last 6 months? Yes. Number of falls 10  LIVING ENVIRONMENT: Lives with: lives alone Lives in: House/apartment Stairs: no Has following equipment at home: Single point cane, Environmental consultant - 4 wheeled, Tour manager, Grab bars, and None  PLOF: Independent and Vocation/Vocational requirements: retired; pt sits to do dishes and doesn't vacuum as much (reports this was the case at [facility] but he is more fatigued now)  PATIENT GOALS: improve dizziness  OBJECTIVE:     TODAY'S TREATMENT: 11/15/23 Activity Comments  UT stretch x 30 Limited  bilat  Levator scap stretch x30   Cervical rotation x10   Anterior scalene stretch x 30   Seated hip hinge looking at target 2x30 No increase in dizziness  Seated thoracic rotation looking at 2 different targets 2x30 No increase in dizziness Small increase in dizziness with second set  Standing hip hinge looking at 2 targets 2x30 Just lightheadeded but not more so  Standing thoracic/lumbar rotation looking at 2 targets 2x30 Just a teeny bit  standing head nods VOR x1; 2x30 Good stability but no increase in s/s  standing head turns VOR x1; 2x30 No increase in symptoms   Standing on foam feet apart:     Static balance x30    Horizontal head turns x30    Vertical head nods x30     Mild sway, no increase in symptoms Cues to target with his eyes   Allowed symptoms to settle in standing, leaning against a wall before pt left           PATIENT EDUCATION: Education details: encouragement pt to reach out to PCP's office to get appointment scheduled to discuss orthostasis; reviewed self-management techniques for orthostasis; provided handout of edu covered today (see pt instructions); discussed use of 4WW as pt reports he only uses it in the home- edu on benefits of using this AD outside d/t frequent falls and difficulty transferring from the floor, POC Person educated: Patient Education method: Explanation, Demonstration, Tactile cues, Verbal cues, and Handouts Education comprehension: verbalized understanding and returned demonstration    HOME EXERCISE PROGRAM Access Code: [facility] URL: [email or web address] Date: 10/27/2023 Prepared by: [facility] - Outpatient  Rehab - [facility]  Program Notes perform exercises at counter with chair nearby for safety  Exercises - Feet Together Balance at Counter Top Eyes Closed  - 1 x daily - 7 x weekly - 3 sets - 15 sec hold - Semi-Tandem Balance at [employer] Eyes Open  - 1 x daily - 7 x weekly - 3 sets - 15  sec hold - Alternating Step Backward with Support  - 1 x daily - 5 x weekly - 2 sets - 10 reps - Standing Quarter Turn with Counter Support  - 1 x daily - 5 x weekly - 2 sets - 10 reps    Note: Objective measures were completed at Evaluation unless otherwise noted.  DIAGNOSTIC FINDINGS: 08/23/23 cervical xray: mild-to-moderate degenerative joint changes of  the mid to lower cervical spine  COGNITION: Overall cognitive status: Within functional limits for tasks assessed   SENSATION: Reports pain and numbness in feet and hands d/t neuropathy   COORDINATION: Alternating pronation/supination: WNL Alternating toe tap: WNL Finger to nose: slightly slowed R   POSTURE: rounded shoulders, forward head, and increased thoracic kyphosis  GAIT: Findings: Comments: slow, flexed trunk   PATIENT SURVEYS:  DHI 76/100   VESTIBULAR ASSESSMENT   GENERAL OBSERVATION: pt wears progressives   OCULOMOTOR EXAM: Ocular Alignment: Cover/Uncover Test: WNL Cover/Cross Cover Test: WNL   Ocular ROM: limited R eye inferior ROM   Spontaneous Nystagmus: absent   Gaze-Induced Nystagmus: absent   Smooth Pursuits: intact   Saccades: 2 saccades vertically, 1 saccade R (normal for age)   Convergence/Divergence: 9.5 cm    VESTIBULAR - OCULAR REFLEX:    Slow VOR: Normal and Comment: c/o dizziness and neck stiffness B planes   VOR Cancellation: Normal   Head-Impulse Test: HIT Right: negative HIT Left: positive     POSITIONAL TESTING:  Right Roll Test: negative Left Roll Test: negative  Right Loaded Dix-Hallpike: limited by neck pain; R upbeating torsional nystagmus but pt asymptomatic Left Loaded Dix-Hallpike: NT                                                                                                                               TREATMENT DATE: 09/08/23    PATIENT EDUCATION: Education details: exam findings, prognosis, POC, edu on BPPV and plan for CRM next session Person educated:  Patient Education method: Medical illustrator Education comprehension: verbalized understanding  HOME EXERCISE PROGRAM: Not initiated  GOALS: Goals reviewed with patient? Yes  SHORT TERM GOALS: Target date: 10/06/2023  Patient to be independent with initial HEP. Baseline: HEP initiated Goal status: MET    LONG TERM GOALS: Target date: 12/15/2023    Patient to be independent with advanced HEP. Baseline: Reviewed partial HEP with return demo 10/18/2023 Goal status: IN PROGRESS  Patient to report 0/10 dizziness with standing vertical and horizontal VOR for 30 seconds. Baseline: Unable; 0/10 Goal status: MET  Patient will report 0/10 dizziness with bed mobility.  Baseline: Symptomatic; 0-2/10 Goal status: IN PROGRESS 11/03/23  Patient to demonstrate cervical AROM WFL and without pain limiting.  Baseline: see above Goal status: IN PROGRESS  Patient to score at least 19/24 on DGI in order to decrease risk of falls. Baseline: 10/24; 15/24 Goal status: IN PROGRESS 11/03/23  Patient to report no falls in the past 6 weeks.  Baseline: 7 falls since concussion; 2 falls Goal status: IN PROGRESS 11/03/23  Patient to score at least 18 points less on DHI in order to meet MCID and improve functional outcomes.  Baseline: 76; 68 Goal status: IN PROGRESS 11/03/23    ASSESSMENT:  CLINICAL IMPRESSION: Pt has a f/u with PCP for his orthostatics. Continued to work on improving motion sensitivity. Provided cervical and thoracolumbar stretching today due to  decreased ROM. Worked on foam this session for balance and vestibular sensory integration. Good tolerance to exercise today with no increase in his baseline symptoms.   OBJECTIVE IMPAIRMENTS: Abnormal gait, decreased activity tolerance, decreased balance, decreased coordination, decreased ROM, dizziness, impaired flexibility, postural dysfunction, and pain.    PLAN:  PT FREQUENCY: 1-2x/week  PT DURATION: 6 weeks  PLANNED  INTERVENTIONS: 97164- PT Re-evaluation, 97750- Physical Performance Testing, 97110-Therapeutic exercises, 97530- Therapeutic activity, [id number]- Neuromuscular re-education, 97535- Self Care, 02859- Manual therapy, [phone]- Gait training, [phone]- Canalith repositioning, [id number]- Aquatic Therapy, [phone]- Electrical stimulation (unattended), Patient/Family education, Balance training, Stair training, Taping, Dry Needling, Joint mobilization, Spinal mobilization, Vestibular training, Cryotherapy, and Moist heat  PLAN FOR NEXT SESSION:  f/u on orthostatics- has he seen PCP? Continue to remind him to perform ankle pumps before transfers; Continue balance exercises-step strategy; motion sensitivity and cervical ROM   Anginette Espejo April Ma L Haislee Corso, [place], DPT 11/15/23 8:46 AM  [facility] Outpatient Rehab at [facility] [address] [place], [address] [place], [place] [address] Phone # [phone] Fax # [phone]

## 2023-11-17 ENCOUNTER — Ambulatory Visit: Admitting: Physical Therapy

## 2023-11-22 ENCOUNTER — Ambulatory Visit: Attending: Family Medicine

## 2023-11-22 DIAGNOSIS — R2681 Unsteadiness on feet: Secondary | ICD-10-CM | POA: Insufficient documentation

## 2023-11-22 DIAGNOSIS — R42 Dizziness and giddiness: Secondary | ICD-10-CM | POA: Insufficient documentation

## 2023-11-22 NOTE — Therapy (Signed)
 OUTPATIENT PHYSICAL THERAPY VESTIBULAR TREATMENT   Patient Name: Antonio Andrade MRN: [id number] DOB:30-Mar-1951, 73 y.o., male Today's Date: 11/22/2023   PCP: Kip Righter, MD  REFERRING PROVIDER: Joane Artist RAMAN, MD     END OF SESSION:  PT End of Session - 11/22/23 0937     Visit Number 12    Number of Visits 21    Date for PT Re-Evaluation 12/15/23    Authorization Type [employer]HC Medicare    Authorization Time Period Approved Service or Care: 6 Therapy Visit(s) from 11/04/2023 to 12/02/2023    Authorization - Visit Number 2    Authorization - Number of Visits 6    Progress Note Due on Visit 19    PT Start Time 0935    PT Stop Time 1015    PT Time Calculation (min) 40 min    Equipment Utilized During Treatment Gait belt    Activity Tolerance Patient tolerated treatment well   lightheadedness   Behavior During Therapy WFL for tasks assessed/performed          Past Medical History:  Diagnosis Date   Acid reflux    Asthma    Atrial fibrillation (HCC)    Bipolar 1 disorder (HCC)    BPH (benign prostatic hyperplasia)    C. difficile colitis    Depression    Diabetes mellitus without complication (HCC)    Fatty liver    Fibromyalgia    Hyperlipidemia    Hypertension    Hypertensive retinopathy    Hypothyroidism    Insomnia    Low vitamin B12 level    Neuropathy    Obesity    Positive FIT (fecal immunochemical test)    Past Surgical History:  Procedure Laterality Date   CHOLECYSTECTOMY     Patient Active Problem List   Diagnosis Date Noted   Attention deficit hyperactivity disorder (ADHD), combined type 09/23/2023   Postconcussion syndrome 09/23/2023   Obesity 03/09/2018   Cellulitis 03/07/2018   Type 2 diabetes mellitus, controlled (HCC) 03/03/2018   Chronic diastolic CHF (congestive heart failure) (HCC) 03/03/2018   Cellulitis of left lower leg 03/03/2018   Weight gain 03/02/2018   Type 2 diabetes mellitus (HCC) 03/02/2018   Diabetic neuropathy (HCC) 03/02/2018    Hypothyroidism 03/02/2018   Chronic anemia 03/02/2018   HTN (hypertension) 03/02/2018   Depression 03/02/2018   Mood disorder (HCC) 03/02/2018   HLD (hyperlipidemia) 03/02/2018   GERD (gastroesophageal reflux disease) 03/02/2018   Asthma 03/02/2018   Muscle tension dysphonia 03/26/2013   Nasal polyp 03/26/2013   Vocal cord atrophy 03/26/2013    ONSET DATE: 07/09/23  REFERRING DIAG: M54.2 (ICD-10-CM) - Neck pain  THERAPY DIAG:  Dizziness and giddiness  Unsteadiness on feet  Rationale for Evaluation and Treatment: Rehabilitation  SUBJECTIVE:  SUBJECTIVE STATEMENT: Has not seen MD for BP issues, will be seeing primary care tomorrow. Still a little discomfort to right of neck. 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 Yes: NPRS scale: 0/10, other than my fingers, they are killing me Pain location: neck pain Pain description: aching, shooting, tingling, sharp Aggravating factors: pt unsure Relieving factors: chiropractor   PRECAUTIONS: Fall  RED FLAGS: None   WEIGHT BEARING RESTRICTIONS: No  FALLS: Has patient fallen in last 6 months? Yes. Number of falls 10  LIVING ENVIRONMENT: Lives with: lives alone Lives in: House/apartment Stairs: no Has following equipment at home: Single point cane, Environmental consultant - 4 wheeled, Tour manager, Grab bars, and None  PLOF: Independent and Vocation/Vocational requirements: retired; pt sits to do dishes and doesn't vacuum as much (reports this was the case at [facility] but he is more fatigued now)  PATIENT GOALS: improve dizziness  OBJECTIVE:     TODAY'S TREATMENT: 11/22/23 Activity Comments  NU-step level 4 x 5 min 100+ SPM, 90 bpm HR  Repeated, alternating pivot turns 5x ea direction. Two rounds 3/10 dizziness  Dynamic balance -forward tandem/retro walk along counter x 2 min -grapevine x 2 min along counter  -lateral step and reach 1x10  habituation -forward bend/diagonal reach for cones from 6 box to upper cabinet--3-4/10 dizzy  Static multisensory balance Mild-moderate sway on compliant surfaces and eyes closed conditions  Education regarding benefits of cardiovascular exercise for PCS      TODAY'S TREATMENT: 11/15/23 Activity Comments  UT stretch x 30 Limited bilat  Levator scap stretch x30   Cervical rotation x10   Anterior scalene stretch x 30   Seated hip hinge looking at target 2x30 No increase in dizziness  Seated thoracic rotation looking at 2 different targets 2x30 No increase in dizziness Small increase in dizziness with second set  Standing hip hinge looking at 2 targets 2x30 Just lightheadeded but not more so  Standing thoracic/lumbar rotation looking at 2 targets 2x30 Just a teeny bit  standing head nods VOR x1; 2x30 Good stability but no increase in s/s  standing head turns VOR x1; 2x30 No increase in symptoms   Standing on foam feet apart:     Static balance x30    Horizontal head turns x30    Vertical head nods x30     Mild sway, no increase in symptoms Cues to target with his eyes   Allowed symptoms to settle in standing, leaning against a wall before pt left           PATIENT EDUCATION: Education details: encouragement pt to reach out to PCP's office to get appointment scheduled to discuss orthostasis; reviewed self-management techniques for orthostasis; provided handout of edu covered today (see pt instructions); discussed use of 4WW as pt reports he only uses it in the home- edu on benefits of using this AD outside d/t frequent falls and difficulty transferring from the floor,  POC Person educated: Patient Education method: Explanation, Demonstration, Tactile cues, Verbal cues, and Handouts Education comprehension: verbalized understanding and returned demonstration    HOME EXERCISE PROGRAM Access Code: [facility] URL: [email or web address] Date: 10/27/2023 Prepared by: [facility] - Outpatient  Rehab - [facility]  Program Notes perform exercises at counter with chair nearby for safety  Exercises - Feet Together Balance at [facility]  Top Eyes Closed  - 1 x daily - 7 x weekly - 3 sets - 15 sec hold - Semi-Tandem Balance at [employer] Eyes Open  - 1 x daily - 7 x weekly - 3 sets - 15 sec hold - Alternating Step Backward with Support  - 1 x daily - 5 x weekly - 2 sets - 10 reps - Standing Quarter Turn with Counter Support  - 1 x daily - 5 x weekly - 2 sets - 10 reps    Note: Objective measures were completed at Evaluation unless otherwise noted.  DIAGNOSTIC FINDINGS: 08/23/23 cervical xray: mild-to-moderate degenerative joint changes of the mid to lower cervical spine  COGNITION: Overall cognitive status: Within functional limits for tasks assessed   SENSATION: Reports pain and numbness in feet and hands d/t neuropathy   COORDINATION: Alternating pronation/supination: WNL Alternating toe tap: WNL Finger to nose: slightly slowed R   POSTURE: rounded shoulders, forward head, and increased thoracic kyphosis  GAIT: Findings: Comments: slow, flexed trunk   PATIENT SURVEYS:  DHI 76/100   VESTIBULAR ASSESSMENT   GENERAL OBSERVATION: pt wears progressives   OCULOMOTOR EXAM: Ocular Alignment: Cover/Uncover Test: WNL Cover/Cross Cover Test: WNL   Ocular ROM: limited R eye inferior ROM   Spontaneous Nystagmus: absent   Gaze-Induced Nystagmus: absent   Smooth Pursuits: intact   Saccades: 2 saccades vertically, 1 saccade R (normal for age)   Convergence/Divergence: 9.5 cm    VESTIBULAR - OCULAR REFLEX:    Slow VOR: Normal and  Comment: c/o dizziness and neck stiffness B planes   VOR Cancellation: Normal   Head-Impulse Test: HIT Right: negative HIT Left: positive     POSITIONAL TESTING:  Right Roll Test: negative Left Roll Test: negative  Right Loaded Dix-Hallpike: limited by neck pain; R upbeating torsional nystagmus but pt asymptomatic Left Loaded Dix-Hallpike: NT                                                                                                                               TREATMENT DATE: 09/08/23    PATIENT EDUCATION: Education details: exam findings, prognosis, POC, edu on BPPV and plan for CRM next session Person educated: Patient Education method: Medical illustrator Education comprehension: verbalized understanding  HOME EXERCISE PROGRAM: Not initiated  GOALS: Goals reviewed with patient? Yes  SHORT TERM GOALS: Target date: 10/06/2023  Patient to be independent with initial HEP. Baseline: HEP initiated Goal status: MET    LONG TERM GOALS: Target date: 12/15/2023    Patient to be independent with advanced HEP. Baseline: Reviewed partial HEP with return demo 10/18/2023 Goal status: IN PROGRESS  Patient to report 0/10 dizziness with standing vertical and horizontal VOR for 30 seconds. Baseline: Unable; 0/10 Goal status: MET  Patient will report 0/10 dizziness with bed mobility.  Baseline: Symptomatic; 0-2/10 Goal status: IN PROGRESS 11/03/23  Patient to demonstrate cervical AROM WFL and without pain limiting.  Baseline: see above Goal status:  IN PROGRESS  Patient to score at least 19/24 on DGI in order to decrease risk of falls. Baseline: 10/24; 15/24 Goal status: IN PROGRESS 11/03/23  Patient to report no falls in the past 6 weeks.  Baseline: 7 falls since concussion; 2 falls Goal status: IN PROGRESS 11/03/23  Patient to score at least 18 points less on DHI in order to meet MCID and improve functional outcomes.  Baseline: 76; 68 Goal status: IN  PROGRESS 11/03/23    ASSESSMENT:  CLINICAL IMPRESSION: Reports ongoing lightheadedness with sit to stand and dizziness w/ larger/faster movements.  Initiated with Nustep for cardiovascular training for improved perfusion. Dynamic and static balance activities to facilitate righting reactions and improve proprioceptive/kinesthetic awareness. Progressed habituation activities for increased repetition and larger amplitude movements tolerating well w/ symptoms not exceeding 3-4/10.  Continued sessions to progress POC details to reduce symptoms and improve activity tolerance.   OBJECTIVE IMPAIRMENTS: Abnormal gait, decreased activity tolerance, decreased balance, decreased coordination, decreased ROM, dizziness, impaired flexibility, postural dysfunction, and pain.    PLAN:  PT FREQUENCY: 1-2x/week  PT DURATION: 6 weeks  PLANNED INTERVENTIONS: 97164- PT Re-evaluation, 97750- Physical Performance Testing, 97110-Therapeutic exercises, 97530- Therapeutic activity, [id number]- Neuromuscular re-education, 97535- Self Care, 02859- Manual therapy, [phone]- Gait training, [phone]- Canalith repositioning, [id number]- Aquatic Therapy, [phone]- Electrical stimulation (unattended), Patient/Family education, Balance training, Stair training, Taping, Dry Needling, Joint mobilization, Spinal mobilization, Vestibular training, Cryotherapy, and Moist heat  PLAN FOR NEXT SESSION:  f/u on orthostatics- has he seen PCP? Continue to remind him to perform ankle pumps before transfers; Continue balance exercises-step strategy; motion sensitivity and cervical ROM   Jonette MARLA Sandifer, PT, DPT 11/22/23 9:37 AM  [facility] Outpatient Rehab at [facility] [address] [place], [address] [place], [place] [address] Phone # [phone] Fax # [phone]

## 2023-11-24 ENCOUNTER — Ambulatory Visit

## 2023-11-29 ENCOUNTER — Telehealth: Payer: Self-pay | Admitting: Physical Therapy

## 2023-11-29 ENCOUNTER — Ambulatory Visit: Admitting: Physical Therapy

## 2023-11-29 NOTE — Telephone Encounter (Signed)
 Called and left message regarding pt's no-show for appointment at 8:45 today.  Offered pt call back to reschedule, as this was his last scheduled PT appointment.  Greig Anon, PT 11/29/23 9:15 AM Phone: [phone] Fax: [phone]

## 2023-11-29 NOTE — Therapy (Incomplete)
 OUTPATIENT PHYSICAL THERAPY VESTIBULAR TREATMENT   Patient Name: Antonio Andrade MRN: [id number] DOB:February 23, 1951, 73 y.o., male Today's Date: 11/29/2023   PCP: Kip Righter, MD  REFERRING PROVIDER: Joane Artist RAMAN, MD     END OF SESSION:    Past Medical History:  Diagnosis Date   Acid reflux    Asthma    Atrial fibrillation (HCC)    Bipolar 1 disorder (HCC)    BPH (benign prostatic hyperplasia)    C. difficile colitis    Depression    Diabetes mellitus without complication (HCC)    Fatty liver    Fibromyalgia    Hyperlipidemia    Hypertension    Hypertensive retinopathy    Hypothyroidism    Insomnia    Low vitamin B12 level    Neuropathy    Obesity    Positive FIT (fecal immunochemical test)    Past Surgical History:  Procedure Laterality Date   CHOLECYSTECTOMY     Patient Active Problem List   Diagnosis Date Noted   Attention deficit hyperactivity disorder (ADHD), combined type 09/23/2023   Postconcussion syndrome 09/23/2023   Obesity 03/09/2018   Cellulitis 03/07/2018   Type 2 diabetes mellitus, controlled (HCC) 03/03/2018   Chronic diastolic CHF (congestive heart failure) (HCC) 03/03/2018   Cellulitis of left lower leg 03/03/2018   Weight gain 03/02/2018   Type 2 diabetes mellitus (HCC) 03/02/2018   Diabetic neuropathy (HCC) 03/02/2018   Hypothyroidism 03/02/2018   Chronic anemia 03/02/2018   HTN (hypertension) 03/02/2018   Depression 03/02/2018   Mood disorder (HCC) 03/02/2018   HLD (hyperlipidemia) 03/02/2018   GERD (gastroesophageal reflux disease) 03/02/2018   Asthma 03/02/2018   Muscle tension dysphonia 03/26/2013   Nasal polyp 03/26/2013   Vocal cord atrophy 03/26/2013    ONSET DATE: 07/09/23  REFERRING DIAG: M54.2 (ICD-10-CM) - Neck pain  THERAPY DIAG:  No diagnosis found.  Rationale for Evaluation and Treatment: Rehabilitation  SUBJECTIVE:                                                                                                                                                                                              SUBJECTIVE STATEMENT: ***Has not seen MD for BP issues, will be seeing primary care tomorrow. Still a little discomfort to right of neck. 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 Yes: NPRS scale: 0/10, other than my fingers, they are killing me Pain location: neck pain Pain  description: aching, shooting, tingling, sharp Aggravating factors: pt unsure Relieving factors: chiropractor   PRECAUTIONS: Fall  RED FLAGS: None   WEIGHT BEARING RESTRICTIONS: No  FALLS: Has patient fallen in last 6 months? Yes. Number of falls 10  LIVING ENVIRONMENT: Lives with: lives alone Lives in: House/apartment Stairs: no Has following equipment at home: Single point cane, Environmental consultant - 4 wheeled, Tour manager, Grab bars, and None  PLOF: Independent and Vocation/Vocational requirements: retired; pt sits to do dishes and doesn't vacuum as much (reports this was the case at [facility] but he is more fatigued now)  PATIENT GOALS: improve dizziness  OBJECTIVE:    TODAY'S TREATMENT: 11/29/2023 Activity Comments                       TODAY'S TREATMENT: 11/22/23 Activity Comments  NU-step level 4 x 5 min 100+ SPM, 90 bpm HR  Repeated, alternating pivot turns 5x ea direction. Two rounds 3/10 dizziness  Dynamic balance -forward tandem/retro walk along counter x 2 min -grapevine x 2 min along counter  -lateral step and reach 1x10  habituation -forward bend/diagonal reach for cones from 6 box to upper cabinet--3-4/10 dizzy  Static multisensory balance Mild-moderate sway on compliant surfaces and eyes closed conditions  Education regarding benefits of cardiovascular exercise for PCS      TODAY'S  TREATMENT: 11/15/23 Activity Comments  UT stretch x 30 Limited bilat  Levator scap stretch x30   Cervical rotation x10   Anterior scalene stretch x 30   Seated hip hinge looking at target 2x30 No increase in dizziness  Seated thoracic rotation looking at 2 different targets 2x30 No increase in dizziness Small increase in dizziness with second set  Standing hip hinge looking at 2 targets 2x30 Just lightheadeded but not more so  Standing thoracic/lumbar rotation looking at 2 targets 2x30 Just a teeny bit  standing head nods VOR x1; 2x30 Good stability but no increase in s/s  standing head turns VOR x1; 2x30 No increase in symptoms   Standing on foam feet apart:     Static balance x30    Horizontal head turns x30    Vertical head nods x30     Mild sway, no increase in symptoms Cues to target with his eyes   Allowed symptoms to settle in standing, leaning against a wall before pt left           PATIENT EDUCATION: Education details: encouragement pt to reach out to PCP's office to get appointment scheduled to discuss orthostasis; reviewed self-management techniques for orthostasis; provided handout of edu covered today (see pt instructions); discussed use of 4WW as pt reports he only uses it in the home- edu on benefits of using this AD outside d/t frequent falls and difficulty transferring from the floor, POC Person educated: Patient Education method: Explanation, Demonstration, Tactile cues, Verbal cues, and Handouts Education comprehension: verbalized understanding and returned demonstration    HOME EXERCISE PROGRAM Access Code: [facility] URL: [email or web address] Date: 10/27/2023 Prepared by: [facility] - Outpatient  Rehab - [facility]  Program Notes perform exercises at counter with chair nearby for safety  Exercises - Feet Together Balance at Counter Top Eyes Closed  - 1 x daily - 7 x weekly - 3 sets - 15 sec hold - Semi-Tandem Balance at  [employer] Eyes Open  - 1 x daily - 7 x weekly - 3 sets - 15 sec hold - Alternating Step Backward with Support  - 1 x daily -  5 x weekly - 2 sets - 10 reps - Standing Quarter Turn with Counter Support  - 1 x daily - 5 x weekly - 2 sets - 10 reps    Note: Objective measures were completed at Evaluation unless otherwise noted.  DIAGNOSTIC FINDINGS: 08/23/23 cervical xray: mild-to-moderate degenerative joint changes of the mid to lower cervical spine  COGNITION: Overall cognitive status: Within functional limits for tasks assessed   SENSATION: Reports pain and numbness in feet and hands d/t neuropathy   COORDINATION: Alternating pronation/supination: WNL Alternating toe tap: WNL Finger to nose: slightly slowed R   POSTURE: rounded shoulders, forward head, and increased thoracic kyphosis  GAIT: Findings: Comments: slow, flexed trunk   PATIENT SURVEYS:  DHI 76/100   VESTIBULAR ASSESSMENT   GENERAL OBSERVATION: pt wears progressives   OCULOMOTOR EXAM: Ocular Alignment: Cover/Uncover Test: WNL Cover/Cross Cover Test: WNL   Ocular ROM: limited R eye inferior ROM   Spontaneous Nystagmus: absent   Gaze-Induced Nystagmus: absent   Smooth Pursuits: intact   Saccades: 2 saccades vertically, 1 saccade R (normal for age)   Convergence/Divergence: 9.5 cm    VESTIBULAR - OCULAR REFLEX:    Slow VOR: Normal and Comment: c/o dizziness and neck stiffness B planes   VOR Cancellation: Normal   Head-Impulse Test: HIT Right: negative HIT Left: positive     POSITIONAL TESTING:  Right Roll Test: negative Left Roll Test: negative  Right Loaded Dix-Hallpike: limited by neck pain; R upbeating torsional nystagmus but pt asymptomatic Left Loaded Dix-Hallpike: NT                                                                                                                               TREATMENT DATE: 09/08/23    PATIENT EDUCATION: Education details: exam findings, prognosis, POC, edu  on BPPV and plan for CRM next session Person educated: Patient Education method: Medical illustrator Education comprehension: verbalized understanding  HOME EXERCISE PROGRAM: Not initiated  GOALS: Goals reviewed with patient? Yes  SHORT TERM GOALS: Target date: 10/06/2023  Patient to be independent with initial HEP. Baseline: HEP initiated Goal status: MET    LONG TERM GOALS: Target date: 12/15/2023    Patient to be independent with advanced HEP. Baseline: Reviewed partial HEP with return demo 10/18/2023 Goal status: IN PROGRESS  Patient to report 0/10 dizziness with standing vertical and horizontal VOR for 30 seconds. Baseline: Unable; 0/10 Goal status: MET  Patient will report 0/10 dizziness with bed mobility.  Baseline: Symptomatic; 0-2/10 Goal status: IN PROGRESS 11/03/23  Patient to demonstrate cervical AROM WFL and without pain limiting.  Baseline: see above Goal status: IN PROGRESS  Patient to score at least 19/24 on DGI in order to decrease risk of falls. Baseline: 10/24; 15/24 Goal status: IN PROGRESS 11/03/23  Patient to report no falls in the past 6 weeks.  Baseline: 7 falls since concussion; 2 falls Goal status: IN PROGRESS 11/03/23  Patient to score at  least 18 points less on DHI in order to meet MCID and improve functional outcomes.  Baseline: 76; 68 Goal status: IN PROGRESS 11/03/23    ASSESSMENT:  CLINICAL IMPRESSION: Reports ongoing lightheadedness with sit to stand and dizziness w/ larger/faster movements.  Initiated with Nustep for cardiovascular training for improved perfusion. Dynamic and static balance activities to facilitate righting reactions and improve proprioceptive/kinesthetic awareness. Progressed habituation activities for increased repetition and larger amplitude movements tolerating well w/ symptoms not exceeding 3-4/10.  Continued sessions to progress POC details to reduce symptoms and improve activity tolerance.   OBJECTIVE  IMPAIRMENTS: Abnormal gait, decreased activity tolerance, decreased balance, decreased coordination, decreased ROM, dizziness, impaired flexibility, postural dysfunction, and pain.    PLAN:  PT FREQUENCY: 1-2x/week  PT DURATION: 6 weeks  PLANNED INTERVENTIONS: [id number]- PT Re-evaluation, 97750- Physical Performance Testing, 97110-Therapeutic exercises, 97530- Therapeutic activity, [id number]- Neuromuscular re-education, 97535- Self Care, [id number]- Manual therapy, [phone]- Gait training, [phone]- Canalith repositioning, [id number]- Aquatic Therapy, [phone]- Electrical stimulation (unattended), Patient/Family education, Balance training, Stair training, Taping, Dry Needling, Joint mobilization, Spinal mobilization, Vestibular training, Cryotherapy, and Moist heat  PLAN FOR NEXT SESSION:  ***f/u on orthostatics- has he seen PCP? Continue to remind him to perform ankle pumps before transfers; Continue balance exercises-step strategy; motion sensitivity and cervical ROM   Greig Anon, PT 11/29/23 7:44 AM Phone: [phone] Fax: [phone]   [facility] at [facility] [address], [address] [place], [place] [address] Phone # [phone] Fax # [phone]

## 2023-12-01 ENCOUNTER — Ambulatory Visit

## 2023-12-06 ENCOUNTER — Ambulatory Visit

## 2023-12-08 ENCOUNTER — Ambulatory Visit

## 2023-12-13 ENCOUNTER — Ambulatory Visit: Admitting: Physical Therapy

## 2023-12-15 ENCOUNTER — Ambulatory Visit: Admitting: Physical Therapy

## 2023-12-18 DIAGNOSIS — I10 Essential (primary) hypertension: Secondary | ICD-10-CM | POA: Diagnosis not present

## 2023-12-18 DIAGNOSIS — E1169 Type 2 diabetes mellitus with other specified complication: Secondary | ICD-10-CM | POA: Diagnosis not present

## 2023-12-18 DIAGNOSIS — E1165 Type 2 diabetes mellitus with hyperglycemia: Secondary | ICD-10-CM | POA: Diagnosis not present

## 2023-12-20 ENCOUNTER — Other Ambulatory Visit: Payer: Self-pay | Admitting: Internal Medicine

## 2023-12-21 DIAGNOSIS — E538 Deficiency of other specified B group vitamins: Secondary | ICD-10-CM | POA: Diagnosis not present

## 2023-12-21 DIAGNOSIS — R42 Dizziness and giddiness: Secondary | ICD-10-CM | POA: Diagnosis not present

## 2023-12-21 DIAGNOSIS — E1139 Type 2 diabetes mellitus with other diabetic ophthalmic complication: Secondary | ICD-10-CM | POA: Diagnosis not present

## 2023-12-21 DIAGNOSIS — Z23 Encounter for immunization: Secondary | ICD-10-CM | POA: Diagnosis not present

## 2023-12-21 DIAGNOSIS — E039 Hypothyroidism, unspecified: Secondary | ICD-10-CM | POA: Diagnosis not present

## 2023-12-21 DIAGNOSIS — I10 Essential (primary) hypertension: Secondary | ICD-10-CM | POA: Diagnosis not present

## 2023-12-21 DIAGNOSIS — E785 Hyperlipidemia, unspecified: Secondary | ICD-10-CM | POA: Diagnosis not present

## 2023-12-21 DIAGNOSIS — D649 Anemia, unspecified: Secondary | ICD-10-CM | POA: Diagnosis not present

## 2024-01-03 ENCOUNTER — Ambulatory Visit: Admitting: Family Medicine

## 2024-01-17 DIAGNOSIS — E1169 Type 2 diabetes mellitus with other specified complication: Secondary | ICD-10-CM | POA: Diagnosis not present

## 2024-01-17 DIAGNOSIS — I10 Essential (primary) hypertension: Secondary | ICD-10-CM | POA: Diagnosis not present

## 2024-01-17 DIAGNOSIS — E1165 Type 2 diabetes mellitus with hyperglycemia: Secondary | ICD-10-CM | POA: Diagnosis not present

## 2024-04-22 NOTE — Progress Notes (Deleted)
  Pt is  no show for appt  

## 2024-04-23 ENCOUNTER — Ambulatory Visit: Attending: Internal Medicine | Admitting: Internal Medicine

## 2024-04-24 ENCOUNTER — Encounter: Payer: Self-pay | Admitting: Internal Medicine
